# Patient Record
Sex: Male | Born: 1955 | Race: White | Hispanic: No | Smoking: Never smoker
Health system: Southern US, Community
[De-identification: ages and names within clinical notes are randomized; demographics above are authoritative.]

## PROBLEM LIST (undated history)

## (undated) DIAGNOSIS — I10 Essential (primary) hypertension: Secondary | ICD-10-CM

## (undated) DIAGNOSIS — E785 Hyperlipidemia, unspecified: Secondary | ICD-10-CM

## (undated) DIAGNOSIS — Z9289 Personal history of other medical treatment: Secondary | ICD-10-CM

## (undated) DIAGNOSIS — M791 Myalgia, unspecified site: Secondary | ICD-10-CM

## (undated) HISTORY — DX: Myalgia, unspecified site: M79.10

## (undated) HISTORY — DX: Hyperlipidemia, unspecified: E78.5

## (undated) HISTORY — DX: Personal history of other medical treatment: Z92.89

## (undated) HISTORY — DX: Essential (primary) hypertension: I10

---

## 2011-03-12 DIAGNOSIS — Z9289 Personal history of other medical treatment: Secondary | ICD-10-CM

## 2011-03-12 HISTORY — DX: Personal history of other medical treatment: Z92.89

## 2012-10-08 ENCOUNTER — Encounter: Payer: Self-pay | Admitting: *Deleted

## 2012-10-08 ENCOUNTER — Other Ambulatory Visit: Payer: Self-pay | Admitting: *Deleted

## 2012-10-08 MED ORDER — VALSARTAN 160 MG PO TABS: 160.0000 mg | ORAL_TABLET | Freq: Every day | ORAL | Status: DC

## 2012-10-16 ENCOUNTER — Encounter: Payer: Self-pay | Admitting: Internal Medicine

## 2012-11-17 ENCOUNTER — Telehealth: Payer: Self-pay | Admitting: Hematology & Oncology

## 2012-11-17 NOTE — Telephone Encounter (Signed)
Pt called wanting to see Dr. Myna Hidalgo for possible prostate CA. Per Dr. Myna Hidalgo pt is aware he needs to get biopsy first and call us when the pathology results come back

## 2013-03-25 ENCOUNTER — Other Ambulatory Visit: Payer: Self-pay | Admitting: Internal Medicine

## 2013-03-28 NOTE — Telephone Encounter (Signed)
Rx was sent to pharmacy electronically. 

## 2013-03-30 ENCOUNTER — Other Ambulatory Visit: Payer: Self-pay

## 2013-03-30 MED ORDER — ATENOLOL 25 MG PO TABS: 25.0000 mg | ORAL_TABLET | Freq: Every day | ORAL | Status: DC

## 2013-03-30 NOTE — Telephone Encounter (Signed)
Rx was sent to pharmacy electronically. 

## 2013-03-31 ENCOUNTER — Other Ambulatory Visit: Payer: Self-pay | Admitting: Internal Medicine

## 2013-04-01 NOTE — Telephone Encounter (Signed)
Rx was sent to pharmacy electronically. 

## 2013-04-26 ENCOUNTER — Other Ambulatory Visit: Payer: Self-pay | Admitting: Internal Medicine

## 2013-04-27 NOTE — Telephone Encounter (Signed)
Rx was sent to pharmacy electronically. 

## 2013-07-27 ENCOUNTER — Telehealth: Payer: Self-pay | Admitting: *Deleted

## 2013-07-27 ENCOUNTER — Other Ambulatory Visit: Payer: Self-pay | Admitting: Internal Medicine

## 2013-07-27 NOTE — Telephone Encounter (Signed)
Gabriel MuirJamie from [facility] called and wanted to get Mr. Gabriel Woodward's medicine approved.  CH

## 2013-07-27 NOTE — Telephone Encounter (Signed)
Rx was sent to pharmacy electronically. Patient will need an appointment for any future refills.

## 2013-07-29 ENCOUNTER — Other Ambulatory Visit: Payer: Self-pay | Admitting: Internal Medicine

## 2013-07-29 NOTE — Telephone Encounter (Signed)
Rx was sent to pharmacy electronically. 

## 2013-08-11 ENCOUNTER — Other Ambulatory Visit: Payer: Self-pay | Admitting: Internal Medicine

## 2013-08-11 NOTE — Telephone Encounter (Signed)
Rx was sent to pharmacy electronically. 

## 2013-08-19 ENCOUNTER — Other Ambulatory Visit: Payer: Self-pay | Admitting: Internal Medicine

## 2013-08-22 NOTE — Telephone Encounter (Signed)
Rx was sent to pharmacy electronically. 

## 2013-08-23 ENCOUNTER — Telehealth: Payer: Self-pay | Admitting: Internal Medicine

## 2013-08-23 NOTE — Telephone Encounter (Signed)
Message forwarded to Jenna, RN.  

## 2013-08-23 NOTE — Telephone Encounter (Signed)
Would you fax his lab order down to [facility] today. He wants to have lab work before his appt.on Friday.

## 2013-08-24 ENCOUNTER — Other Ambulatory Visit: Payer: Self-pay | Admitting: *Deleted

## 2013-08-24 ENCOUNTER — Telehealth: Payer: Self-pay | Admitting: Internal Medicine

## 2013-08-24 DIAGNOSIS — I10 Essential (primary) hypertension: Secondary | ICD-10-CM

## 2013-08-24 DIAGNOSIS — E782 Mixed hyperlipidemia: Secondary | ICD-10-CM

## 2013-08-24 LAB — CBC
HCT: 40.2 % (ref 39.0–52.0)
Hemoglobin: 13.7 g/dL (ref 13.0–17.0)
MCH: 28.8 pg (ref 26.0–34.0)
MCHC: 34.1 g/dL (ref 30.0–36.0)
MCV: 84.6 fL (ref 78.0–100.0)
Platelets: 235 10*3/uL (ref 150–400)
RBC: 4.75 MIL/uL (ref 4.22–5.81)
RDW: 13.8 % (ref 11.5–15.5)
WBC: 5.1 10*3/uL (ref 4.0–10.5)

## 2013-08-24 LAB — COMPREHENSIVE METABOLIC PANEL
ALK PHOS: 52 U/L (ref 39–117)
ALT: 15 U/L (ref 0–53)
AST: 18 U/L (ref 0–37)
Albumin: 3.9 g/dL (ref 3.5–5.2)
BILIRUBIN TOTAL: 0.6 mg/dL (ref 0.2–1.2)
BUN: 16 mg/dL (ref 6–23)
CO2: 27 mEq/L (ref 19–32)
Calcium: 8.8 mg/dL (ref 8.4–10.5)
Chloride: 107 mEq/L (ref 96–112)
Creat: 1 mg/dL (ref 0.50–1.35)
Glucose, Bld: 95 mg/dL (ref 70–99)
Potassium: 4.6 mEq/L (ref 3.5–5.3)
SODIUM: 140 meq/L (ref 135–145)
TOTAL PROTEIN: 5.8 g/dL — AB (ref 6.0–8.3)

## 2013-08-24 LAB — LIPID PANEL
CHOL/HDL RATIO: 3.7 ratio
CHOLESTEROL: 172 mg/dL (ref 0–200)
HDL: 46 mg/dL (ref >39)
LDL Cholesterol: 103 mg/dL — ABNORMAL HIGH (ref 0–99)
Triglycerides: 115 mg/dL (ref <150)
VLDL: 23 mg/dL (ref 0–40)

## 2013-08-24 NOTE — Telephone Encounter (Signed)
Lab orders placed by Gabriel Woodward - [facility] in [facility] now

## 2013-08-24 NOTE — Patient Instructions (Signed)
Patient requested orders to be sent to [facility] so that he can have blood drawn prior to his appointment. Order for C-MET, CBC, LIPID ordered. 

## 2013-08-24 NOTE — Telephone Encounter (Signed)
Lab order placed by Henrene PastorW. Waddell - [facility]

## 2013-08-26 ENCOUNTER — Encounter: Payer: Self-pay | Admitting: Internal Medicine

## 2013-08-26 ENCOUNTER — Encounter: Payer: Self-pay | Admitting: *Deleted

## 2013-08-26 ENCOUNTER — Ambulatory Visit (INDEPENDENT_AMBULATORY_CARE_PROVIDER_SITE_OTHER): Payer: BC Managed Care – PPO | Admitting: Internal Medicine

## 2013-08-26 VITALS — BP 144/94 | HR 57 | Ht 72.0 in | Wt 188.8 lb

## 2013-08-26 DIAGNOSIS — I10 Essential (primary) hypertension: Secondary | ICD-10-CM

## 2013-08-26 DIAGNOSIS — R079 Chest pain, unspecified: Secondary | ICD-10-CM

## 2013-08-26 DIAGNOSIS — E785 Hyperlipidemia, unspecified: Secondary | ICD-10-CM

## 2013-08-26 MED ORDER — VALSARTAN 160 MG PO TABS: ORAL_TABLET | ORAL | Status: DC

## 2013-08-26 MED ORDER — ATENOLOL 25 MG PO TABS: ORAL_TABLET | ORAL | Status: DC

## 2013-08-26 MED ORDER — LIVALO 2 MG PO TABS: ORAL_TABLET | ORAL | Status: DC

## 2013-08-26 MED ORDER — HYDROCHLOROTHIAZIDE 12.5 MG PO CAPS: ORAL_CAPSULE | ORAL | Status: DC

## 2013-08-26 NOTE — Patient Instructions (Signed)
Your physician has requested that you have an exercise tolerance test. For further information please visit [email or web address]. Please also follow instruction sheet, as given.  Your physician wants you to follow-up in: 1 year. You will receive a reminder letter in the mail two months in advance. If you don't receive a letter, please call our office to schedule the follow-up appointment.  

## 2013-08-26 NOTE — Progress Notes (Signed)
OFFICE NOTE  Chief Complaint:  Annual follow-up, chest pain  Primary Care Physician: No PCP Per Patient  HPI:  Gabriel Woodward is a 58 year old gentleman previously followed by Dr. Clarene DukeLittle who followed his father who had a strong family history of heart disease. The patient has now coronary disease, however, is certainly concerned given his family history. He does have a history of hypertension and dyslipidemia which has been well controlled. He also has a history of myalgias on Crestor and recently was switched to Pravachol. This also seemed to cause him symptoms. He is quite weary of taking any further statin medications. He recently had a lipid profile performed on June 14, 2012 which showed a total cholesterol of 231, triglycerides 216, HDL 42, LDL of 146. TSH was within normal limits as well as a CBC and CMP. I reviewed the results with the patient today, indicating that he still continues to have elevated cholesterol which would put him at risk, especially given his family history.  I started him on Livalo 2 mg daily at his last office visit. He's taken the medication without any side effects.  He recently had repeat blood work which shows normal renal function, liver function and his cholesterol profile was markedly improved. Total cholesterol was down to 172 and LDL cholesterol was 103.  Overall he seems to be doing well except he reported a couple times when he felt like he was walking up until he was a little short of breath had some discomfort in the top of his chest and felt that his heart was racing. He does not exercise regularly enough to recreate his symptoms. He did have a treadmill exercise stress test last in 2012.  PMHx:  Past Medical History  Diagnosis Date  . Hypertension   . Hyperlipemia   . Myalgia   . H/O exercise stress test 03/12/2011    Treadmill was negative for ischemic graded, patient demontrated excellent excersie capacity    History reviewed. No  pertinent past surgical history.  FAMHx:  Family History  Problem Relation Age of Onset  . CAD Father     byass surgery early 6260's first MI late 50'sor 6760's  . Hypertension Mother     SOCHx:   does not have a smoking history on file. He has never used smokeless tobacco. He reports that he drinks about 1.5 - 2 ounces of alcohol per week. He reports that he does not use illicit drugs.  ALLERGIES:  No Known Allergies  ROS: A comprehensive review of systems was negative except for: Cardiovascular: positive for chest pain and palpitations  HOME MEDS: Current Outpatient Prescriptions  Medication Sig Dispense Refill  . aspirin 81 MG tablet Take 81 mg by mouth daily.      Marland Kitchen. atenolol (TENORMIN) 25 MG tablet take 1 tablet by mouth once daily  90 tablet  3  . hydrochlorothiazide (MICROZIDE) 12.5 MG capsule take 1 capsule by mouth once daily  90 capsule  3  . LIVALO 2 MG TABS take 1 tablet by mouth at bedtime  90 tablet  3  . valsartan (DIOVAN) 160 MG tablet take 1 tablet by mouth once daily  90 tablet  3   No current facility-administered medications for this visit.    LABS/IMAGING: No results found for this or any previous visit (from the past 48 hour(s)). No results found.  VITALS: BP 144/94  Pulse 57  Ht 6' (1.829 m)  Wt 188 lb 12.8 oz (85.639 kg)  BMI  25.60 kg/m2  EXAM: General appearance: alert and no distress Neck: no carotid bruit and no JVD Lungs: clear to auscultation bilaterally Heart: regular rate and rhythm, S1, S2 normal, no murmur, click, rub or gallop Abdomen: soft, non-tender; bowel sounds normal; no masses,  no organomegaly Extremities: extremities normal, atraumatic, no cyanosis or edema Pulses: 2+ and symmetric Skin: Skin color, texture, turgor normal. No rashes or lesions Neurologic: Grossly normal Psych: Mood, affect normal  EKG: Sinus bradycardia 57  ASSESSMENT: 1. Chest pain with exertion 2. Hypertension 3. Dyslipidemia-improved  PLAN: 1.    Mr. [name] has a much better lipid profile now that he is on a statin. He did describe a couple episodes of chest tightness and heart racing with exercise. This does not sound cardiac however could be. He does have a compelling family history of heart disease. I would recommend a Bruce treadmill stress test to compare to his study in 2012. I'll contact him with those results. Otherwise plan to see him back annually.  Chrystie NoseKenneth C. Jencarlo Bonadonna, MD, [facility] Attending Cardiologist [facility]  Chrystie NoseKenneth C. Feleica Fulmore 08/26/2013, 12:34 PM

## 2013-08-29 NOTE — Telephone Encounter (Signed)
Encounter Closed---08/29/13 TP 

## 2013-09-27 ENCOUNTER — Encounter (HOSPITAL_COMMUNITY): Payer: BC Managed Care – PPO

## 2013-10-05 ENCOUNTER — Telehealth (HOSPITAL_COMMUNITY): Payer: Self-pay

## 2013-10-07 ENCOUNTER — Ambulatory Visit (HOSPITAL_COMMUNITY)
Admission: RE | Admit: 2013-10-07 | Discharge: 2013-10-07 | Disposition: A | Discharge status: Home / Self Care | Payer: BC Managed Care – PPO | Source: Ambulatory Visit | Attending: Cardiology | Admitting: Cardiology

## 2013-10-07 DIAGNOSIS — R079 Chest pain, unspecified: Secondary | ICD-10-CM

## 2013-10-17 ENCOUNTER — Telehealth: Payer: Self-pay | Admitting: *Deleted

## 2013-10-17 NOTE — Telephone Encounter (Signed)
Patient notified that stress test was normal.

## 2013-11-03 NOTE — Telephone Encounter (Signed)
Encounter complete. 

## 2014-08-25 ENCOUNTER — Other Ambulatory Visit: Payer: Self-pay | Admitting: Internal Medicine

## 2014-08-25 NOTE — Telephone Encounter (Signed)
Rx(s) sent to pharmacy electronically. OV 09/28/14

## 2014-09-27 ENCOUNTER — Telehealth: Payer: Self-pay | Admitting: Internal Medicine

## 2014-09-27 DIAGNOSIS — E785 Hyperlipidemia, unspecified: Secondary | ICD-10-CM

## 2014-09-27 LAB — LIPID PANEL
Cholesterol: 194 mg/dL (ref 0–200)
HDL: 49 mg/dL (ref >=40)
LDL Cholesterol: 119 mg/dL — ABNORMAL HIGH (ref 0–99)
TRIGLYCERIDES: 131 mg/dL (ref <150)
Total CHOL/HDL Ratio: 4 Ratio
VLDL: 26 mg/dL (ref 0–40)

## 2014-09-27 LAB — CBC WITH DIFFERENTIAL/PLATELET
Basophils Absolute: 0.1 10*3/uL (ref 0.0–0.1)
Basophils Relative: 1 % (ref 0–1)
EOS PCT: 4 % (ref 0–5)
Eosinophils Absolute: 0.2 10*3/uL (ref 0.0–0.7)
HEMATOCRIT: 45.4 % (ref 39.0–52.0)
HEMOGLOBIN: 15.2 g/dL (ref 13.0–17.0)
LYMPHS ABS: 1.1 10*3/uL (ref 0.7–4.0)
Lymphocytes Relative: 20 % (ref 12–46)
MCH: 28.8 pg (ref 26.0–34.0)
MCHC: 33.5 g/dL (ref 30.0–36.0)
MCV: 86 fL (ref 78.0–100.0)
MONOS PCT: 10 % (ref 3–12)
MPV: 9.7 fL (ref 8.6–12.4)
Monocytes Absolute: 0.5 10*3/uL (ref 0.1–1.0)
NEUTROS ABS: 3.4 10*3/uL (ref 1.7–7.7)
Neutrophils Relative %: 65 % (ref 43–77)
Platelets: 285 10*3/uL (ref 150–400)
RBC: 5.28 MIL/uL (ref 4.22–5.81)
RDW: 13.5 % (ref 11.5–15.5)
WBC: 5.3 10*3/uL (ref 4.0–10.5)

## 2014-09-27 LAB — COMPREHENSIVE METABOLIC PANEL
ALBUMIN: 4.4 g/dL (ref 3.5–5.2)
ALK PHOS: 51 U/L (ref 39–117)
ALT: 16 U/L (ref 0–53)
AST: 19 U/L (ref 0–37)
BILIRUBIN TOTAL: 0.7 mg/dL (ref 0.2–1.2)
BUN: 14 mg/dL (ref 6–23)
CHLORIDE: 105 meq/L (ref 96–112)
CO2: 28 meq/L (ref 19–32)
CREATININE: 0.98 mg/dL (ref 0.50–1.35)
Calcium: 9.3 mg/dL (ref 8.4–10.5)
Glucose, Bld: 92 mg/dL (ref 70–99)
POTASSIUM: 5 meq/L (ref 3.5–5.3)
SODIUM: 142 meq/L (ref 135–145)
Total Protein: 6.3 g/dL (ref 6.0–8.3)

## 2014-09-27 NOTE — Telephone Encounter (Signed)
Pt has an appt tomorrow with Dr Rennis GoldenHilty. He usually gets his lab work before his appt.[facility] He is coming in a little while to get it.done.

## 2014-09-27 NOTE — Telephone Encounter (Signed)
Lab orders entered,cmet,lipid panel,cbc.

## 2014-09-28 ENCOUNTER — Ambulatory Visit (INDEPENDENT_AMBULATORY_CARE_PROVIDER_SITE_OTHER): Payer: BLUE CROSS/BLUE SHIELD | Admitting: Internal Medicine

## 2014-09-28 ENCOUNTER — Encounter: Payer: Self-pay | Admitting: Internal Medicine

## 2014-09-28 VITALS — BP 148/86 | HR 61 | Ht 72.0 in | Wt 186.5 lb

## 2014-09-28 DIAGNOSIS — E785 Hyperlipidemia, unspecified: Secondary | ICD-10-CM | POA: Diagnosis not present

## 2014-09-28 DIAGNOSIS — Z87898 Personal history of other specified conditions: Secondary | ICD-10-CM

## 2014-09-28 DIAGNOSIS — Z8679 Personal history of other diseases of the circulatory system: Secondary | ICD-10-CM

## 2014-09-28 DIAGNOSIS — I10 Essential (primary) hypertension: Secondary | ICD-10-CM

## 2014-09-28 DIAGNOSIS — R002 Palpitations: Secondary | ICD-10-CM | POA: Diagnosis not present

## 2014-09-28 NOTE — Patient Instructions (Signed)
Your physician recommends that you return for lab work in 1 year - FASTING.  Dr Rennis GoldenHilty recommends that you schedule a follow-up appointment in 1 year. You will receive a reminder letter in the mail two months in advance. If you don't receive a letter, please call our office to schedule the follow-up appointment.

## 2014-09-29 ENCOUNTER — Encounter: Payer: Self-pay | Admitting: Internal Medicine

## 2014-09-29 DIAGNOSIS — Z87898 Personal history of other specified conditions: Secondary | ICD-10-CM | POA: Insufficient documentation

## 2014-09-29 NOTE — Progress Notes (Signed)
OFFICE NOTE  Chief Complaint:  Annual follow-up, no complaints  Primary Care Physician: No PCP Per Patient  HPI:  Gabriel Woodward is a 59 year old gentleman previously followed by Dr. Clarene DukeLittle who followed his father who had a strong family history of heart disease. The patient has now coronary disease, however, is certainly concerned given his family history. He does have a history of hypertension and dyslipidemia which has been well controlled. He also has a history of myalgias on Crestor and recently was switched to Pravachol. This also seemed to cause him symptoms. He is quite weary of taking any further statin medications. He recently had a lipid profile performed on June 14, 2012 which showed a total cholesterol of 231, triglycerides 216, HDL 42, LDL of 146. TSH was within normal limits as well as a CBC and CMP. I reviewed the results with the patient today, indicating that he still continues to have elevated cholesterol which would put him at risk, especially given his family history.  I started him on Livalo 2 mg daily at his last office visit. He's taken the medication without any side effects.  He recently had repeat blood work which shows normal renal function, liver function and his cholesterol profile was markedly improved. Total cholesterol was down to 172 and LDL cholesterol was 103.  Overall he seems to be doing well except he reported a couple times when he felt like he was walking up until he was a little short of breath had some discomfort in the top of his chest and felt that his heart was racing. He does not exercise regularly enough to recreate his symptoms. He did have a treadmill exercise stress test last in 2012.  Gabriel Woodward returns today for follow-up. Overall he is doing well and has no complaints. Denies chest pain or shortness of breath. He had recent laboratory work done yesterday and the results are as follows: Total cholesterol 194, triglycerides 131, HDL 49 and  LDL 119. This is mildly increased compared to his cluster profile the past however may be accounted for recent dietary indiscretion. I do not see a clear indication to change his medicines. Blood pressure is fairly well-controlled today. He says it's been lower at home.  PMHx:  Past Medical History  Diagnosis Date  . Hypertension   . Hyperlipemia   . Myalgia   . H/O exercise stress test 03/12/2011    Treadmill was negative for ischemic graded, patient demontrated excellent excersie capacity    No past surgical history on file.  FAMHx:  Family History  Problem Relation Age of Onset  . CAD Father     byass surgery early 7460's first MI late 50'sor 7260's  . Hypertension Mother     SOCHx:   reports that he has never smoked. He has never used smokeless tobacco. He reports that he drinks about 1.8 - 2.4 oz of alcohol per week. He reports that he does not use illicit drugs.  ALLERGIES:  No Known Allergies  ROS: A comprehensive review of systems was negative.  HOME MEDS: Current Outpatient Prescriptions  Medication Sig Dispense Refill  . aspirin 81 MG tablet Take 81 mg by mouth daily.    Marland Kitchen. atenolol (TENORMIN) 25 MG tablet take 1 tablet by mouth once daily 90 tablet 0  . hydrochlorothiazide (MICROZIDE) 12.5 MG capsule take 1 capsule by mouth once daily 90 capsule 0  . LIVALO 2 MG TABS take 1 tablet by mouth at bedtime 90 tablet 3  .  valsartan (DIOVAN) 160 MG tablet take 1 tablet by mouth once daily 90 tablet 0   No current facility-administered medications for this visit.    LABS/IMAGING: No results found for this or any previous visit (from the past 48 hour(s)). No results found.  VITALS: BP 148/86 mmHg  Pulse 61  Ht 6' (1.829 m)  Wt 186 lb 8 oz (84.596 kg)  BMI 25.29 kg/m2  EXAM: General appearance: alert and no distress Neck: no carotid bruit and no JVD Lungs: clear to auscultation bilaterally Heart: regular rate and rhythm, S1, S2 normal, no murmur, click, rub or  gallop Abdomen: soft, non-tender; bowel sounds normal; no masses,  no organomegaly Extremities: extremities normal, atraumatic, no cyanosis or edema Pulses: 2+ and symmetric Skin: Skin color, texture, turgor normal. No rashes or lesions Neurologic: Grossly normal Psych: Mood, affect normal  EKG: Normal sinus rhythm at 61  ASSESSMENT: 1. Hypertension 2. Dyslipidemia 3. History of palpitations  PLAN: 1.   Gabriel Woodward has good control of his cholesterol. I recommend he stay on his current dose of Livalo. Blood pressure is also well controlled. He denies any chest pain or shortness of breath. He denies any recurrent palpitations. Overall doing well and plan to see him back annually or sooner as necessary.  Chrystie Nose, MD, [facility] Attending Cardiologist [facility]  Chrystie Nose 09/29/2014, 2:17 PM

## 2014-11-24 ENCOUNTER — Other Ambulatory Visit: Payer: Self-pay | Admitting: Internal Medicine

## 2014-11-24 NOTE — Telephone Encounter (Signed)
Rx(s) sent to pharmacy electronically.  

## 2014-11-30 ENCOUNTER — Other Ambulatory Visit: Payer: Self-pay | Admitting: Internal Medicine

## 2014-11-30 NOTE — Telephone Encounter (Signed)
REFILL 

## 2015-05-26 ENCOUNTER — Other Ambulatory Visit: Payer: Self-pay | Admitting: Internal Medicine

## 2015-05-28 NOTE — Telephone Encounter (Signed)
Rx request sent to pharmacy.  

## 2015-06-01 ENCOUNTER — Telehealth: Payer: Self-pay

## 2015-06-01 NOTE — Telephone Encounter (Signed)
Prior authorization for Livalo 2 mg sent to patient's insurance company via covermymeds. Awaiting approval. 

## 2015-06-04 NOTE — Telephone Encounter (Signed)
Prior authorization for Livalo 2 mg has been denied.

## 2015-06-15 NOTE — Telephone Encounter (Signed)
PA for livalo approved from 06/01/2015 - 04/27/2038

## 2015-06-15 NOTE — Telephone Encounter (Signed)
Resent prior authorization due to being filled out incorrectly.

## 2015-08-30 ENCOUNTER — Other Ambulatory Visit: Payer: Self-pay | Admitting: *Deleted

## 2015-08-30 DIAGNOSIS — E785 Hyperlipidemia, unspecified: Secondary | ICD-10-CM

## 2015-11-27 ENCOUNTER — Other Ambulatory Visit: Payer: Self-pay | Admitting: Internal Medicine

## 2015-12-02 ENCOUNTER — Other Ambulatory Visit: Payer: Self-pay | Admitting: Internal Medicine

## 2015-12-03 NOTE — Telephone Encounter (Signed)
Rx(s) sent to pharmacy electronically.  

## 2016-04-10 ENCOUNTER — Other Ambulatory Visit: Payer: Self-pay | Admitting: Internal Medicine

## 2016-05-16 ENCOUNTER — Telehealth: Payer: Self-pay | Admitting: Internal Medicine

## 2016-05-16 DIAGNOSIS — Z79899 Other long term (current) drug therapy: Secondary | ICD-10-CM

## 2016-05-16 DIAGNOSIS — E785 Hyperlipidemia, unspecified: Secondary | ICD-10-CM

## 2016-05-16 NOTE — Telephone Encounter (Signed)
CMET, CBC, Lipid ordered.   Patient aware.

## 2016-05-16 NOTE — Telephone Encounter (Signed)
New Message      Can you put in the order for his lab work and call him , he has appt with Dr Rennis GoldenHilty on 06/03/16

## 2016-05-21 ENCOUNTER — Other Ambulatory Visit: Payer: Self-pay | Admitting: Internal Medicine

## 2016-05-21 MED ORDER — LIVALO 2 MG PO TABS: 1.0000 | ORAL_TABLET | Freq: Every day | ORAL | 0 refills | Status: DC

## 2016-05-21 NOTE — Telephone Encounter (Signed)
°*  STAT* If patient is at the pharmacy, call can be transferred to refill team.   1. Which medications need to be refilled? (please list name of each medication and dose if known) Livalo 2mg   2. Which pharmacy/location (including street and city if local pharmacy) is medication to be sent to? [facility] store [phone]#[id number] [address] [place], [place] [phone]  3. Do they need a 30 day or 90 day supply?90

## 2016-05-21 NOTE — Telephone Encounter (Signed)
Rx(s) sent to pharmacy electronically.  

## 2016-05-30 LAB — LIPID PANEL
CHOL/HDL RATIO: 5.1 ratio — AB (ref <5.0)
Cholesterol: 220 mg/dL — ABNORMAL HIGH (ref <200)
HDL: 43 mg/dL (ref >40)
LDL Cholesterol: 144 mg/dL — ABNORMAL HIGH (ref <100)
Triglycerides: 164 mg/dL — ABNORMAL HIGH (ref <150)
VLDL: 33 mg/dL — ABNORMAL HIGH (ref <30)

## 2016-06-03 ENCOUNTER — Encounter: Payer: Self-pay | Admitting: Internal Medicine

## 2016-06-03 ENCOUNTER — Ambulatory Visit (INDEPENDENT_AMBULATORY_CARE_PROVIDER_SITE_OTHER): Payer: BLUE CROSS/BLUE SHIELD | Admitting: Internal Medicine

## 2016-06-03 VITALS — BP 120/74 | HR 64 | Ht 72.0 in | Wt 190.2 lb

## 2016-06-03 DIAGNOSIS — I10 Essential (primary) hypertension: Secondary | ICD-10-CM | POA: Diagnosis not present

## 2016-06-03 DIAGNOSIS — E785 Hyperlipidemia, unspecified: Secondary | ICD-10-CM | POA: Diagnosis not present

## 2016-06-03 DIAGNOSIS — R002 Palpitations: Secondary | ICD-10-CM | POA: Diagnosis not present

## 2016-06-03 MED ORDER — PITAVASTATIN CALCIUM 4 MG PO TABS: 4.0000 mg | ORAL_TABLET | Freq: Every day | ORAL | 3 refills | Status: DC

## 2016-06-03 NOTE — Patient Instructions (Addendum)
Your physician has recommended you make the following change in your medication:  -- INCREASE Livalo to 4mg  daily (you can take two of your 2mg  tablets until you run out)  -- 4 boxes of samples & co-pay card provided to patient   Your physician recommends that you return for lab work in: THREE MONTHS (fasting - to check cholesterol)  Your physician wants you to follow-up in: ONE YEAR with Dr. Rennis GoldenHilty. You will receive a reminder letter in the mail two months in advance. If you don't receive a letter, please call our office to schedule the follow-up appointment.

## 2016-06-03 NOTE — Progress Notes (Signed)
OFFICE NOTE  Chief Complaint:  Annual follow-up, no complaints  Primary Care Physician: No PCP Per Patient  HPI:  Gabriel Woodward is a 61 year old gentleman previously followed by Dr. Clarene Duke who followed his father who had a strong family history of heart disease. The patient has now coronary disease, however, is certainly concerned given his family history. He does have a history of hypertension and dyslipidemia which has been well controlled. He also has a history of myalgias on Crestor and recently was switched to Pravachol. This also seemed to cause him symptoms. He is quite weary of taking any further statin medications. He recently had a lipid profile performed on June 14, 2012 which showed a total cholesterol of 231, triglycerides 216, HDL 42, LDL of 146. TSH was within normal limits as well as a CBC and CMP. I reviewed the results with the patient today, indicating that he still continues to have elevated cholesterol which would put him at risk, especially given his family history.  I started him on Livalo 2 mg daily at his last office visit. He's taken the medication without any side effects.  He recently had repeat blood work which shows normal renal function, liver function and his cholesterol profile was markedly improved. Total cholesterol was down to 172 and LDL cholesterol was 103.  Overall he seems to be doing well except he reported a couple times when he felt like he was walking up until he was a little short of breath had some discomfort in the top of his chest and felt that his heart was racing. He does not exercise regularly enough to recreate his symptoms. He did have a treadmill exercise stress test last in 2012.  Gabriel Woodward returns today for follow-up. Overall he is doing well and has no complaints. Denies chest pain or shortness of breath. He had recent laboratory work done yesterday and the results are as follows: Total cholesterol 194, triglycerides 131, HDL 49 and  LDL 119. This is mildly increased compared to his cluster profile the past however may be accounted for recent dietary indiscretion. I do not see a clear indication to change his medicines. Blood pressure is fairly well-controlled today. He says it's been lower at home.  06/03/2016  Gabriel Woodward returns today for follow-up. Her last series done very well. Blood pressures are well-controlled today 120/74. EKG shows sinus rhythm at 64. He underwent recent cholesterol testing. He does have a history of statin intolerance including atorvastatin and simvastatin. His most recent cholesterol was total cholesterol of 220, HDL-C 43, LDL-C1 44, triglyceride 164. This is increased with LDL of 119 one year ago and 1031 year prior to that. He continues to take Livalo 2 mg daily which she seems to tolerate. The only issue he has is cost of the medication which recently was $500 for 90 day supply.  PMHx:  Past Medical History:  Diagnosis Date  . H/O exercise stress test 03/12/2011   Treadmill was negative for ischemic graded, patient demontrated excellent excersie capacity  . Hyperlipemia   . Hypertension   . Myalgia     No past surgical history on file.  FAMHx:  Family History  Problem Relation Age of Onset  . CAD Father     byass surgery early 45's first MI late 50'sor 32's  . Hypertension Mother     SOCHx:   reports that he has never smoked. He has never used smokeless tobacco. He reports that he drinks about 1.8 - 2.4 oz  of alcohol per week . He reports that he does not use drugs.  ALLERGIES:  No Known Allergies  ROS: Pertinent items noted in HPI and remainder of comprehensive ROS otherwise negative.  HOME MEDS: Current Outpatient Prescriptions  Medication Sig Dispense Refill  . aspirin 81 MG tablet Take 81 mg by mouth daily.    Marland Kitchen. atenolol (TENORMIN) 25 MG tablet take 1 tablet by mouth once daily 90 tablet 1  . hydrochlorothiazide (MICROZIDE) 12.5 MG capsule take 1 capsule by mouth once  daily 90 capsule 1  . valsartan (DIOVAN) 160 MG tablet take 1 tablet by mouth once daily 90 tablet 1  . Pitavastatin Calcium 4 MG TABS Take 1 tablet (4 mg total) by mouth daily. 90 tablet 3   No current facility-administered medications for this visit.     LABS/IMAGING: No results found for this or any previous visit (from the past 48 hour(s)). No results found.  VITALS: BP 120/74   Pulse 64   Ht 6' (1.829 m)   Wt 190 lb 3.2 oz (86.3 kg)   BMI 25.80 kg/m   EXAM: General appearance: alert and no distress Neck: no carotid bruit and no JVD Lungs: clear to auscultation bilaterally Heart: regular rate and rhythm, S1, S2 normal, no murmur, click, rub or gallop Abdomen: soft, non-tender; bowel sounds normal; no masses,  no organomegaly Extremities: extremities normal, atraumatic, no cyanosis or edema Pulses: 2+ and symmetric Skin: Skin color, texture, turgor normal. No rashes or lesions Neurologic: Grossly normal Psych: Mood, affect normal  EKG: Normal sinus rhythm at 64  ASSESSMENT: 1. Hypertension 2. Dyslipidemia 3. History of palpitations  PLAN: 1.   Gabriel Woodward has excellent blood pressure control and no significant palpitations. Cholesterol still not at goal despite being on a low-dose of moderate intensity statin. He's concerned about the cost however given his partial insurance he should be able to get the medication with only $20 co-pay. We provided a co-pay assistance card today and I recommended increasing his Livalo to 4 mg daily. Plan repeat lipid profile in 3 months. If he still not at goal LDL we may consider adding ezetimibe 10 mg daily.  Follow-up annually or sooner as necessary.  Chrystie NoseKenneth C. Marsheila Alejo, MD, [facility] Attending Cardiologist [facility]  Chrystie NoseKenneth C Taraoluwa Thakur 06/03/2016, 5:22 PM

## 2016-06-04 ENCOUNTER — Other Ambulatory Visit: Payer: Self-pay | Admitting: Internal Medicine

## 2016-06-04 NOTE — Telephone Encounter (Signed)
Rx(s) sent to pharmacy electronically.  

## 2016-12-18 ENCOUNTER — Telehealth: Payer: Self-pay | Admitting: *Deleted

## 2016-12-18 MED ORDER — IRBESARTAN 150 MG PO TABS: 150.0000 mg | ORAL_TABLET | Freq: Every day | ORAL | 3 refills | Status: DC

## 2016-12-18 NOTE — Telephone Encounter (Signed)
Patient on valsartan 160 mg.  No indication of HF. Okay to switch to irbesartan 150 mg and check BP at home qod x 2 weeks.

## 2016-12-18 NOTE — Telephone Encounter (Signed)
Patient left a msg on the refill vm in regards to the recall on valsartan. Patient stated that he has a full bottle left . He can be reached at [phone]. Thanks, MI

## 2016-12-18 NOTE — Telephone Encounter (Signed)
Spoke with pt, New script sent to the pharmacy and directions per pharm md discussed.

## 2017-06-11 ENCOUNTER — Other Ambulatory Visit: Payer: Self-pay | Admitting: Internal Medicine

## 2017-06-11 NOTE — Telephone Encounter (Signed)
REFILL 

## 2017-07-19 ENCOUNTER — Other Ambulatory Visit: Payer: Self-pay | Admitting: Internal Medicine

## 2017-07-23 ENCOUNTER — Other Ambulatory Visit: Payer: Self-pay | Admitting: Internal Medicine

## 2017-07-23 DIAGNOSIS — E785 Hyperlipidemia, unspecified: Secondary | ICD-10-CM

## 2017-08-22 ENCOUNTER — Other Ambulatory Visit: Payer: Self-pay | Admitting: Internal Medicine

## 2017-08-22 DIAGNOSIS — E785 Hyperlipidemia, unspecified: Secondary | ICD-10-CM

## 2017-09-09 ENCOUNTER — Other Ambulatory Visit: Payer: Self-pay | Admitting: Internal Medicine

## 2017-09-09 NOTE — Telephone Encounter (Signed)
Rx(s) sent to pharmacy electronically.  

## 2017-09-13 ENCOUNTER — Other Ambulatory Visit: Payer: Self-pay | Admitting: Internal Medicine

## 2017-09-13 DIAGNOSIS — E785 Hyperlipidemia, unspecified: Secondary | ICD-10-CM

## 2017-09-14 NOTE — Telephone Encounter (Signed)
Rx has been sent to the pharmacy electronically. ° °

## 2017-09-21 ENCOUNTER — Other Ambulatory Visit: Payer: Self-pay | Admitting: Internal Medicine

## 2017-09-22 NOTE — Telephone Encounter (Signed)
Rx sent to pharmacy   

## 2017-10-17 ENCOUNTER — Other Ambulatory Visit: Payer: Self-pay | Admitting: Internal Medicine

## 2017-10-19 NOTE — Telephone Encounter (Signed)
Rx request sent to pharmacy.  

## 2017-11-22 ENCOUNTER — Other Ambulatory Visit: Payer: Self-pay | Admitting: Internal Medicine

## 2017-11-23 NOTE — Telephone Encounter (Signed)
Rx(s) sent to pharmacy electronically. MD OV 12/15/17

## 2017-12-08 ENCOUNTER — Other Ambulatory Visit: Payer: Self-pay

## 2017-12-08 MED ORDER — IRBESARTAN 150 MG PO TABS: 150.0000 mg | ORAL_TABLET | Freq: Every day | ORAL | 0 refills | Status: DC

## 2017-12-14 ENCOUNTER — Telehealth: Payer: Self-pay | Admitting: Internal Medicine

## 2017-12-14 DIAGNOSIS — I10 Essential (primary) hypertension: Secondary | ICD-10-CM

## 2017-12-14 DIAGNOSIS — E785 Hyperlipidemia, unspecified: Secondary | ICD-10-CM

## 2017-12-14 NOTE — Telephone Encounter (Signed)
New Message         Patient would like to come in today for labs,  pls advise

## 2017-12-14 NOTE — Telephone Encounter (Signed)
Returned call to patient he stated he wanted to have fasting lab today.Stated he has appointment with Dr.Hilty tomorrow.Advised he can have lab here at office today, lab orders given to lab tech.

## 2017-12-15 ENCOUNTER — Ambulatory Visit (INDEPENDENT_AMBULATORY_CARE_PROVIDER_SITE_OTHER): Payer: BLUE CROSS/BLUE SHIELD | Admitting: Internal Medicine

## 2017-12-15 ENCOUNTER — Other Ambulatory Visit: Payer: Self-pay | Admitting: Internal Medicine

## 2017-12-15 ENCOUNTER — Encounter: Payer: Self-pay | Admitting: Internal Medicine

## 2017-12-15 VITALS — BP 140/98 | HR 60 | Ht 72.0 in | Wt 186.0 lb

## 2017-12-15 DIAGNOSIS — I10 Essential (primary) hypertension: Secondary | ICD-10-CM

## 2017-12-15 DIAGNOSIS — E785 Hyperlipidemia, unspecified: Secondary | ICD-10-CM

## 2017-12-15 LAB — COMPREHENSIVE METABOLIC PANEL
ALT: 16 IU/L (ref 0–44)
AST: 14 IU/L (ref 0–40)
Albumin/Globulin Ratio: 2 (ref 1.2–2.2)
Albumin: 4.5 g/dL (ref 3.6–4.8)
Alkaline Phosphatase: 68 IU/L (ref 39–117)
BUN/Creatinine Ratio: 13 (ref 10–24)
BUN: 14 mg/dL (ref 8–27)
Bilirubin Total: 0.7 mg/dL (ref 0.0–1.2)
CO2: 23 mmol/L (ref 20–29)
CREATININE: 1.1 mg/dL (ref 0.76–1.27)
Calcium: 9.1 mg/dL (ref 8.6–10.2)
Chloride: 104 mmol/L (ref 96–106)
GFR calc non Af Amer: 71 mL/min/{1.73_m2} (ref >59)
GFR, EST AFRICAN AMERICAN: 82 mL/min/{1.73_m2} (ref >59)
GLOBULIN, TOTAL: 2.2 g/dL (ref 1.5–4.5)
Glucose: 95 mg/dL (ref 65–99)
Potassium: 4.6 mmol/L (ref 3.5–5.2)
SODIUM: 142 mmol/L (ref 134–144)
TOTAL PROTEIN: 6.7 g/dL (ref 6.0–8.5)

## 2017-12-15 LAB — CBC WITH DIFFERENTIAL/PLATELET
Basophils Absolute: 0.1 10*3/uL (ref 0.0–0.2)
Basos: 2 %
EOS (ABSOLUTE): 0.2 10*3/uL (ref 0.0–0.4)
Eos: 3 %
HEMOGLOBIN: 15.2 g/dL (ref 13.0–17.7)
Hematocrit: 44.4 % (ref 37.5–51.0)
IMMATURE GRANULOCYTES: 1 %
Immature Grans (Abs): 0 10*3/uL (ref 0.0–0.1)
LYMPHS: 23 %
Lymphocytes Absolute: 1.6 10*3/uL (ref 0.7–3.1)
MCH: 29.3 pg (ref 26.6–33.0)
MCHC: 34.2 g/dL (ref 31.5–35.7)
MCV: 86 fL (ref 79–97)
MONOCYTES: 10 %
Monocytes Absolute: 0.7 10*3/uL (ref 0.1–0.9)
NEUTROS ABS: 4.2 10*3/uL (ref 1.4–7.0)
NEUTROS PCT: 61 %
PLATELETS: 298 10*3/uL (ref 150–450)
RBC: 5.18 x10E6/uL (ref 4.14–5.80)
RDW: 13.2 % (ref 12.3–15.4)
WBC: 6.8 10*3/uL (ref 3.4–10.8)

## 2017-12-15 LAB — LIPID PANEL W/O CHOL/HDL RATIO
CHOLESTEROL TOTAL: 187 mg/dL (ref 100–199)
HDL: 45 mg/dL (ref >39)
LDL Calculated: 120 mg/dL — ABNORMAL HIGH (ref 0–99)
Triglycerides: 111 mg/dL (ref 0–149)
VLDL Cholesterol Cal: 22 mg/dL (ref 5–40)

## 2017-12-15 MED ORDER — EZETIMIBE 10 MG PO TABS: 10.0000 mg | ORAL_TABLET | Freq: Every day | ORAL | 3 refills | Status: DC

## 2017-12-15 NOTE — Progress Notes (Signed)
OFFICE NOTE  Chief Complaint:  No complaints  Primary Care Physician: Patient, No Pcp Per  HPI:  Gabriel Woodward is a 62 year old gentleman previously followed by Dr. Clarene DukeLittle who followed his father who had a strong family history of heart disease. The patient has now coronary disease, however, is certainly concerned given his family history. He does have a history of hypertension and dyslipidemia which has been well controlled. He also has a history of myalgias on Crestor and recently was switched to Pravachol. This also seemed to cause him symptoms. He is quite weary of taking any further statin medications. He recently had a lipid profile performed on June 14, 2012 which showed a total cholesterol of 231, triglycerides 216, HDL 42, LDL of 146. TSH was within normal limits as well as a CBC and CMP. I reviewed the results with the patient today, indicating that he still continues to have elevated cholesterol which would put him at risk, especially given his family history.  I started him on Livalo 2 mg daily at his last office visit. He's taken the medication without any side effects.  He recently had repeat blood work which shows normal renal function, liver function and his cholesterol profile was markedly improved. Total cholesterol was down to 172 and LDL cholesterol was 103.  Overall he seems to be doing well except he reported a couple times when he felt like he was walking up until he was a little short of breath had some discomfort in the top of his chest and felt that his heart was racing. He does not exercise regularly enough to recreate his symptoms. He did have a treadmill exercise stress test last in 2012.  Gabriel Woodward returns today for follow-up. Overall he is doing well and has no complaints. Denies chest pain or shortness of breath. He had recent laboratory work done yesterday and the results are as follows: Total cholesterol 194, triglycerides 131, HDL 49 and LDL 119. This is  mildly increased compared to his cluster profile the past however may be accounted for recent dietary indiscretion. I do not see a clear indication to change his medicines. Blood pressure is fairly well-controlled today. He says it's been lower at home.  06/03/2016  Gabriel Woodward returns today for follow-up. Her last series done very well. Blood pressures are well-controlled today 120/74. EKG shows sinus rhythm at 64. He underwent recent cholesterol testing. He does have a history of statin intolerance including atorvastatin and simvastatin. His most recent cholesterol was total cholesterol of 220, HDL-C 43, LDL-C1 44, triglyceride 164. This is increased with LDL of 119 one year ago and 1031 year prior to that. He continues to take Livalo 2 mg daily which she seems to tolerate. The only issue he has is cost of the medication which recently was $500 for 90 day supply.  12/15/2017  Gabriel Woodward was seen today in follow-up.  Blood pressure was slightly elevated at 140/98.  He says he does not follow it regularly at home but concerns me that his blood pressure might not be well controlled.  In addition he had increased his Livalo to 4 mg daily.  Repeat lipid profile shows mild improvement in his lipid profile including a decrease in triglycerides from 164-111 and calculated LDL from 144-120.  Despite this, he is not a goal LDL less than 100.  PMHx:  Past Medical History:  Diagnosis Date  . H/O exercise stress test 03/12/2011   Treadmill was negative for ischemic graded, patient  demontrated excellent excersie capacity  . Hyperlipemia   . Hypertension   . Myalgia     No past surgical history on file.  FAMHx:  Family History  Problem Relation Age of Onset  . CAD Father        byass surgery early 2660's first MI late 50'sor 5160's  . Hypertension Mother     SOCHx:   reports that he has never smoked. He has never used smokeless tobacco. He reports that he drinks about 3.0 - 4.0 standard drinks of alcohol  per week. He reports that he does not use drugs.  ALLERGIES:  No Known Allergies  ROS: Pertinent items noted in HPI and remainder of comprehensive ROS otherwise negative.  HOME MEDS: Current Outpatient Medications  Medication Sig Dispense Refill  . aspirin 81 MG tablet Take 81 mg by mouth daily.    Marland Kitchen. atenolol (TENORMIN) 25 MG tablet Take 1 tablet (25 mg total) by mouth daily. 30 tablet 0  . hydrochlorothiazide (MICROZIDE) 12.5 MG capsule TAKE ONE CAPSULE BY MOUTH DAILY 90 capsule 0  . irbesartan (AVAPRO) 150 MG tablet Take 1 tablet (150 mg total) by mouth daily. 90 tablet 0  . LIVALO 4 MG TABS TAKE 1 TABLET(4 MG) BY MOUTH DAILY 90 tablet 0   No current facility-administered medications for this visit.     LABS/IMAGING: Results for orders placed or performed in visit on 12/14/17 (from the past 48 hour(s))  Comprehensive Metabolic Panel (CMET)     Status: None   Collection Time: 12/14/17 12:17 PM  Result Value Ref Range   Glucose 95 65 - 99 mg/dL   BUN 14 8 - 27 mg/dL   Creatinine, Ser 4.781.10 0.76 - 1.27 mg/dL   GFR calc non Af Amer 71 >59 mL/min/1.73   GFR calc Af Amer 82 >59 mL/min/1.73   BUN/Creatinine Ratio 13 10 - 24   Sodium 142 134 - 144 mmol/L   Potassium 4.6 3.5 - 5.2 mmol/L   Chloride 104 96 - 106 mmol/L   CO2 23 20 - 29 mmol/L   Calcium 9.1 8.6 - 10.2 mg/dL   Total Protein 6.7 6.0 - 8.5 g/dL   Albumin 4.5 3.6 - 4.8 g/dL   Globulin, Total 2.2 1.5 - 4.5 g/dL   Albumin/Globulin Ratio 2.0 1.2 - 2.2   Bilirubin Total 0.7 0.0 - 1.2 mg/dL   Alkaline Phosphatase 68 39 - 117 IU/L   AST 14 0 - 40 IU/L   ALT 16 0 - 44 IU/L  Lipid Panel w/o Chol/HDL Ratio     Status: Abnormal   Collection Time: 12/14/17 12:17 PM  Result Value Ref Range   Cholesterol, Total 187 100 - 199 mg/dL   Triglycerides 295111 0 - 149 mg/dL   HDL 45 >62>39 mg/dL   VLDL Cholesterol Cal 22 5 - 40 mg/dL   LDL Calculated 130120 (H) 0 - 99 mg/dL  CBC w/Diff/Platelet     Status: None   Collection Time: 12/14/17  12:17 PM  Result Value Ref Range   WBC 6.8 3.4 - 10.8 x10E3/uL   RBC 5.18 4.14 - 5.80 x10E6/uL   Hemoglobin 15.2 13.0 - 17.7 g/dL   Hematocrit 86.544.4 78.437.5 - 51.0 %   MCV 86 79 - 97 fL   MCH 29.3 26.6 - 33.0 pg   MCHC 34.2 31.5 - 35.7 g/dL   RDW 69.613.2 29.512.3 - 28.415.4 %   Platelets 298 150 - 450 x10E3/uL   Neutrophils 61 Not Estab. %  Lymphs 23 Not Estab. %   Monocytes 10 Not Estab. %   Eos 3 Not Estab. %   Basos 2 Not Estab. %   Neutrophils Absolute 4.2 1.4 - 7.0 x10E3/uL   Lymphocytes Absolute 1.6 0.7 - 3.1 x10E3/uL   Monocytes Absolute 0.7 0.1 - 0.9 x10E3/uL   EOS (ABSOLUTE) 0.2 0.0 - 0.4 x10E3/uL   Basophils Absolute 0.1 0.0 - 0.2 x10E3/uL   Immature Granulocytes 1 Not Estab. %   Immature Grans (Abs) 0.0 0.0 - 0.1 x10E3/uL   No results found.  VITALS: BP (!) 140/98 (BP Location: Right Arm, Patient Position: Sitting, Cuff Size: Normal)   Pulse 60   Ht 6' (1.829 m)   Wt 186 lb (84.4 kg)   BMI 25.23 kg/m   EXAM: General appearance: alert and no distress Neck: no carotid bruit and no JVD Lungs: clear to auscultation bilaterally Heart: regular rate and rhythm, S1, S2 normal, no murmur, click, rub or gallop Abdomen: soft, non-tender; bowel sounds normal; no masses,  no organomegaly Extremities: extremities normal, atraumatic, no cyanosis or edema Pulses: 2+ and symmetric Skin: Skin color, texture, turgor normal. No rashes or lesions Neurologic: Grossly normal Psych: Mood, affect normal  EKG: Normal sinus rhythm at 60-personally reviewed  ASSESSMENT: 1. Hypertension 2. Dyslipidemia 3. History of palpitations  PLAN: 1.   Gabriel Woodward continues to be suboptimally controlled with regards to dyslipidemia on maximum dose Livalo 4 mg.  He denies any symptoms such as myalgias with this which he previously had on Crestor.  I believe we will be able to achieve a goal LDL less than 100 if we add in ezetimibe.  He is agreeable to that.  Repeat lipid profile in 3 months and he is  advised to follow his blood pressures at home and contact us with those numbers we may need to adjust his blood pressure medicines accordingly.  Chrystie Nose, MD, [facility], FACP  [place]  [facility]  Medical Director of the Advanced Lipid Disorders &  Cardiovascular Risk Reduction Clinic Diplomate of the American Board of Clinical Lipidology Attending Cardiologist  Direct Dial: [phone]  Fax: [phone]  Website:  [email or web address] 12/15/2017, 2:10 PM

## 2017-12-15 NOTE — Patient Instructions (Signed)
Medication Instructions:   START zetia 10mg  daily  Labwork:  FASTING lab work in 3 months to recheck cholesterol  Testing/Procedures:  NONE  Follow-Up:  Your physician wants you to follow-up in: 3 months with Dr. Rennis GoldenHilty. Please have lab work prior.  If you need a refill on your cardiac medications before your next appointment, please call your pharmacy.  Any Other Special Instructions Will Be Listed Below (If Applicable).  HOW TO TAKE YOUR BLOOD PRESSURE:  Rest 5 minutes before taking your blood pressure.  Don't smoke or drink caffeinated beverages for at least 30 minutes before.  Take your blood pressure before (not after) you eat.  Sit comfortably with your back supported and both feet on the floor (don't cross your legs).  Elevate your arm to heart level on a table or a desk.  Use the proper sized cuff. It should fit smoothly and snugly around your bare upper arm. There should be enough room to slip a fingertip under the cuff. The bottom edge of the cuff should be 1 inch above the crease of the elbow.  Ideally, take 3 measurements at one sitting and record the average.

## 2017-12-22 ENCOUNTER — Other Ambulatory Visit: Payer: Self-pay | Admitting: Internal Medicine

## 2017-12-22 NOTE — Telephone Encounter (Signed)
Rx sent to pharmacy   

## 2018-01-15 ENCOUNTER — Other Ambulatory Visit: Payer: Self-pay | Admitting: Internal Medicine

## 2018-01-22 ENCOUNTER — Other Ambulatory Visit: Payer: Self-pay | Admitting: Internal Medicine

## 2018-01-22 NOTE — Telephone Encounter (Signed)
Rx request sent to pharmacy.  

## 2018-02-07 ENCOUNTER — Other Ambulatory Visit: Payer: Self-pay | Admitting: Internal Medicine

## 2018-03-09 ENCOUNTER — Other Ambulatory Visit: Payer: Self-pay | Admitting: Internal Medicine

## 2018-03-09 DIAGNOSIS — E785 Hyperlipidemia, unspecified: Secondary | ICD-10-CM

## 2018-03-16 LAB — LIPID PANEL
CHOL/HDL RATIO: 3.4 ratio (ref 0.0–5.0)
Cholesterol, Total: 179 mg/dL (ref 100–199)
HDL: 53 mg/dL (ref >39)
LDL Calculated: 94 mg/dL (ref 0–99)
Triglycerides: 160 mg/dL — ABNORMAL HIGH (ref 0–149)
VLDL Cholesterol Cal: 32 mg/dL (ref 5–40)

## 2018-03-17 ENCOUNTER — Encounter: Payer: Self-pay | Admitting: Internal Medicine

## 2018-03-17 ENCOUNTER — Ambulatory Visit (INDEPENDENT_AMBULATORY_CARE_PROVIDER_SITE_OTHER): Payer: BLUE CROSS/BLUE SHIELD | Admitting: Internal Medicine

## 2018-03-17 VITALS — BP 130/75 | HR 66 | Ht 72.0 in | Wt 192.0 lb

## 2018-03-17 DIAGNOSIS — I10 Essential (primary) hypertension: Secondary | ICD-10-CM | POA: Diagnosis not present

## 2018-03-17 DIAGNOSIS — E785 Hyperlipidemia, unspecified: Secondary | ICD-10-CM

## 2018-03-17 DIAGNOSIS — Z8249 Family history of ischemic heart disease and other diseases of the circulatory system: Secondary | ICD-10-CM

## 2018-03-17 NOTE — Progress Notes (Signed)
OFFICE NOTE  Chief Complaint:  Follow-up dyslipidemia  Primary Care Physician: Patient, No Pcp Per  HPI:  Gabriel Woodward is a 62 year old gentleman previously followed by Dr. Clarene Duke who followed his father who had a strong family history of heart disease. The patient has now coronary disease, however, is certainly concerned given his family history. He does have a history of hypertension and dyslipidemia which has been well controlled. He also has a history of myalgias on Crestor and recently was switched to Pravachol. This also seemed to cause him symptoms. He is quite weary of taking any further statin medications. He recently had a lipid profile performed on June 14, 2012 which showed a total cholesterol of 231, triglycerides 216, HDL 42, LDL of 146. TSH was within normal limits as well as a CBC and CMP. I reviewed the results with the patient today, indicating that he still continues to have elevated cholesterol which would put him at risk, especially given his family history.  I started him on Livalo 2 mg daily at his last office visit. He's taken the medication without any side effects.  He recently had repeat blood work which shows normal renal function, liver function and his cholesterol profile was markedly improved. Total cholesterol was down to 172 and LDL cholesterol was 103.  Overall he seems to be doing well except he reported a couple times when he felt like he was walking up until he was a little short of breath had some discomfort in the top of his chest and felt that his heart was racing. He does not exercise regularly enough to recreate his symptoms. He did have a treadmill exercise stress test last in 2012.  Gabriel Woodward returns today for follow-up. Overall he is doing well and has no complaints. Denies chest pain or shortness of breath. He had recent laboratory work done yesterday and the results are as follows: Total cholesterol 194, triglycerides 131, HDL 49 and LDL 119.  This is mildly increased compared to his cluster profile the past however may be accounted for recent dietary indiscretion. I do not see a clear indication to change his medicines. Blood pressure is fairly well-controlled today. He says it's been lower at home.  06/03/2016  Gabriel Woodward returns today for follow-up. Her last series done very well. Blood pressures are well-controlled today 120/74. EKG shows sinus rhythm at 64. He underwent recent cholesterol testing. He does have a history of statin intolerance including atorvastatin and simvastatin. His most recent cholesterol was total cholesterol of 220, HDL-C 43, LDL-C1 44, triglyceride 164. This is increased with LDL of 119 one year ago and 1031 year prior to that. He continues to take Livalo 2 mg daily which she seems to tolerate. The only issue he has is cost of the medication which recently was $500 for 90 day supply.  12/15/2017  Gabriel Woodward was seen today in follow-up.  Blood pressure was slightly elevated at 140/98.  He says he does not follow it regularly at home but concerns me that his blood pressure might not be well controlled.  In addition he had increased his Livalo to 4 mg daily.  Repeat lipid profile shows mild improvement in his lipid profile including a decrease in triglycerides from 164-111 and calculated LDL from 144-120.  Despite this, he is not a goal LDL less than 100.  03/17/2018  Gabriel Woodward is seen today for follow-up of dyslipidemia.  Since starting ezetimibe he seen a marked improvement in his lipids.  His total cholesterol yesterday was 179, triglycerides 160, HDL 53 and LDL 94.  He is now at target less than 100.  We did discuss his family history in more detail today and he is concerned about whether he is developed any coronary disease.  As he is asymptomatic I think there is little evidence for additional therapy, especially with Vascepa.  Although his triglycerides remain elevated, he has no documented coronary disease.   We did discuss possibility of coronary artery calcium scoring which he seemed to be in favor of.  PMHx:  Past Medical History:  Diagnosis Date  . H/O exercise stress test 03/12/2011   Treadmill was negative for ischemic graded, patient demontrated excellent excersie capacity  . Hyperlipemia   . Hypertension   . Myalgia     No past surgical history on file.  FAMHx:  Family History  Problem Relation Age of Onset  . CAD Father        byass surgery early 70's first MI late 50'sor 38's  . Hypertension Mother     SOCHx:   reports that he has never smoked. He has never used smokeless tobacco. He reports that he drinks about 3.0 - 4.0 standard drinks of alcohol per week. He reports that he does not use drugs.  ALLERGIES:  No Known Allergies  ROS: Pertinent items noted in HPI and remainder of comprehensive ROS otherwise negative.  HOME MEDS: Current Outpatient Medications  Medication Sig Dispense Refill  . aspirin 81 MG tablet Take 81 mg by mouth daily.    Marland Kitchen atenolol (TENORMIN) 25 MG tablet TAKE 1 TABLET(25 MG) BY MOUTH DAILY 30 tablet 4  . ezetimibe (ZETIA) 10 MG tablet Take 1 tablet (10 mg total) by mouth daily. 90 tablet 3  . hydrochlorothiazide (MICROZIDE) 12.5 MG capsule TAKE 1 CAPSULE BY MOUTH DAILY 90 capsule 3  . irbesartan (AVAPRO) 150 MG tablet Take 1 tablet (150 mg total) by mouth daily. 90 tablet 0  . LIVALO 4 MG TABS TAKE 1 TABLET(4 MG) BY MOUTH DAILY 90 tablet 3   No current facility-administered medications for this visit.     LABS/IMAGING: Results for orders placed or performed in visit on 12/15/17 (from the past 48 hour(s))  Lipid panel     Status: Abnormal   Collection Time: 03/16/18 10:49 AM  Result Value Ref Range   Cholesterol, Total 179 100 - 199 mg/dL   Triglycerides 409 (H) 0 - 149 mg/dL   HDL 53 >81 mg/dL   VLDL Cholesterol Cal 32 5 - 40 mg/dL   LDL Calculated 94 0 - 99 mg/dL   Chol/HDL Ratio 3.4 0.0 - 5.0 ratio    Comment:                                    T. Chol/HDL Ratio                                             Men  Women                               1/2 Avg.Risk  3.4    3.3  Avg.Risk  5.0    4.4                                2X Avg.Risk  9.6    7.1                                3X Avg.Risk 23.4   11.0    No results found.  VITALS: BP 130/75   Pulse 66   Ht 6' (1.829 m)   Wt 192 lb (87.1 kg)   SpO2 99%   BMI 26.04 kg/m   EXAM: Deferred  EKG: Deferred  ASSESSMENT: 1. Hypertension 2. Dyslipidemia 3. History of palpitations  4. Family history of premature CAD  PLAN: 1.   Gabriel Woodward has a family history of premature coronary disease and dyslipidemia with the current goal LDL less than 100.  I like for him to have coronary artery calcium scoring and may further intensify his therapy based on those results.  He could be a candidate for addition of Vascepa as he is on max tolerated statin therapy and ezetimibe at this point.  We may also consider PCSK9 inhibitor.  Plan follow-up with me afterwards.  Chrystie NoseKenneth C. Osmel Dykstra, MD, [facility], FACP  [place]  [facility]  Medical Director of the Advanced Lipid Disorders &  Cardiovascular Risk Reduction Clinic Diplomate of the American Board of Clinical Lipidology Attending Cardiologist  Direct Dial: [phone]  Fax: [phone]  Website:  [email or web address]  Adlee Paar C Illene Sweeting 03/17/2018, 2:16 PM

## 2018-03-17 NOTE — Patient Instructions (Signed)
Medication Instructions:  Continue current medications If you need a refill on your cardiac medications before your next appointment, please call your pharmacy.   Lab work: NONE  Testing/Procedures: Dr. Rennis Golden has ordered a CT coronary calcium score. This test is done at [address]. This is $150 out of pocket.   Coronary CalciumScan A coronary calcium scan is an imaging test used to look for deposits of calcium and other fatty materials (plaques) in the inner lining of the blood vessels of the heart (coronary arteries). These deposits of calcium and plaques can partly clog and narrow the coronary arteries without producing any symptoms or warning signs. This puts a person at risk for a heart attack. This test can detect these deposits before symptoms develop. Tell a health care provider about:  Any allergies you have.  All medicines you are taking, including vitamins, herbs, eye drops, creams, and over-the-counter medicines.  Any problems you or family members have had with anesthetic medicines.  Any blood disorders you have.  Any surgeries you have had.  Any medical conditions you have.  Whether you are pregnant or may be pregnant. What are the risks? Generally, this is a safe procedure. However, problems may occur, including:  Harm to a pregnant woman and her unborn baby. This test involves the use of radiation. Radiation exposure can be dangerous to a pregnant woman and her unborn baby. If you are pregnant, you generally should not have this procedure done.  Slight increase in the risk of cancer. This is because of the radiation involved in the test. What happens before the procedure? No preparation is needed for this procedure. What happens during the procedure?  You will undress and remove any jewelry around your neck or chest.  You will put on a hospital gown.  Sticky electrodes will be placed on your chest. The electrodes will be connected to an  electrocardiogram (ECG) machine to record a tracing of the electrical activity of your heart.  A CT scanner will take pictures of your heart. During this time, you will be asked to lie still and hold your breath for 2-3 seconds while a picture of your heart is being taken. The procedure may vary among health care providers and hospitals. What happens after the procedure?  You can get dressed.  You can return to your normal activities.  It is up to you to get the results of your test. Ask your health care provider, or the department that is doing the test, when your results will be ready. Summary  A coronary calcium scan is an imaging test used to look for deposits of calcium and other fatty materials (plaques) in the inner lining of the blood vessels of the heart (coronary arteries).  Generally, this is a safe procedure. Tell your health care provider if you are pregnant or may be pregnant.  No preparation is needed for this procedure.  A CT scanner will take pictures of your heart.  You can return to your normal activities after the scan is done. This information is not intended to replace advice given to you by your health care provider. Make sure you discuss any questions you have with your health care provider. Document Released: 10/11/2007 Document Revised: 03/03/2016 Document Reviewed: 03/03/2016 Elsevier Interactive Patient Education  2017 [employer].   Follow-Up: At [facility], you and your health needs are our priority.  As part of our continuing mission to provide you with exceptional heart care, we have  created designated Provider Care Teams.  These Care Teams include your primary Cardiologist (physician) and Advanced Practice Providers (APPs -  Physician Assistants and Nurse Practitioners) who all work together to provide you with the care you need, when you need it. You will need a follow up appointment in 6 months.  Please call our office 2 months in advance to  schedule this appointment.  You may see Dr. Rennis GoldenHilty or one of the following Advanced Practice Providers on your designated Care Team: Azalee CourseHao Meng, [place] JerseyPA-C . Micah FlesherAngela Duke, PA-C  Any Other Special Instructions Will Be Listed Below (If Applicable).

## 2018-04-07 ENCOUNTER — Ambulatory Visit (INDEPENDENT_AMBULATORY_CARE_PROVIDER_SITE_OTHER)
Admission: RE | Admit: 2018-04-07 | Discharge: 2018-04-07 | Disposition: A | Discharge status: Home / Self Care | Payer: Self-pay | Source: Ambulatory Visit | Attending: Internal Medicine | Admitting: Internal Medicine

## 2018-04-07 DIAGNOSIS — Z8249 Family history of ischemic heart disease and other diseases of the circulatory system: Secondary | ICD-10-CM

## 2018-04-07 DIAGNOSIS — E785 Hyperlipidemia, unspecified: Secondary | ICD-10-CM

## 2018-04-10 ENCOUNTER — Other Ambulatory Visit: Payer: Self-pay | Admitting: Internal Medicine

## 2018-04-13 ENCOUNTER — Other Ambulatory Visit: Payer: Self-pay | Admitting: *Deleted

## 2018-04-13 DIAGNOSIS — E785 Hyperlipidemia, unspecified: Secondary | ICD-10-CM

## 2018-04-14 NOTE — Telephone Encounter (Signed)
 *  STAT* If patient is at the pharmacy, call can be transferred to refill team.   1. Which medications need to be refilled? (please list name of each medication and dose if known) irbesartan (AVAPRO) 150 MG tablet  2. Which pharmacy/location (including street and city if local pharmacy) is medication to be sent to? [facility]  3. Do they need a 30 day or 90 day supply? 90  Patient has been out of medication since Saturday

## 2018-07-23 ENCOUNTER — Other Ambulatory Visit: Payer: Self-pay | Admitting: Internal Medicine

## 2018-10-27 ENCOUNTER — Other Ambulatory Visit: Payer: Self-pay | Admitting: Internal Medicine

## 2018-12-13 LAB — LIPID PANEL
Chol/HDL Ratio: 3.5 ratio (ref 0.0–5.0)
Cholesterol, Total: 170 mg/dL (ref 100–199)
HDL: 49 mg/dL (ref >39)
LDL Calculated: 95 mg/dL (ref 0–99)
Triglycerides: 130 mg/dL (ref 0–149)
VLDL Cholesterol Cal: 26 mg/dL (ref 5–40)

## 2018-12-14 LAB — CBC WITH DIFFERENTIAL/PLATELET
Basophils Absolute: 0.1 10*3/uL (ref 0.0–0.2)
Basos: 2 %
EOS (ABSOLUTE): 0.2 10*3/uL (ref 0.0–0.4)
Eos: 4 %
Hematocrit: 44.3 % (ref 37.5–51.0)
Hemoglobin: 15.6 g/dL (ref 13.0–17.7)
Immature Grans (Abs): 0 10*3/uL (ref 0.0–0.1)
Immature Granulocytes: 0 %
Lymphocytes Absolute: 1.3 10*3/uL (ref 0.7–3.1)
Lymphs: 21 %
MCH: 29.4 pg (ref 26.6–33.0)
MCHC: 35.2 g/dL (ref 31.5–35.7)
MCV: 83 fL (ref 79–97)
Monocytes Absolute: 0.5 10*3/uL (ref 0.1–0.9)
Monocytes: 8 %
Neutrophils Absolute: 4 10*3/uL (ref 1.4–7.0)
Neutrophils: 65 %
Platelets: 301 10*3/uL (ref 150–450)
RBC: 5.31 x10E6/uL (ref 4.14–5.80)
RDW: 11.8 % (ref 11.6–15.4)
WBC: 6 10*3/uL (ref 3.4–10.8)

## 2018-12-14 LAB — COMPREHENSIVE METABOLIC PANEL
ALT: 19 IU/L (ref 0–44)
AST: 15 IU/L (ref 0–40)
Albumin/Globulin Ratio: 2.2 (ref 1.2–2.2)
Albumin: 4.8 g/dL (ref 3.8–4.8)
Alkaline Phosphatase: 74 IU/L (ref 39–117)
BUN/Creatinine Ratio: 9 — ABNORMAL LOW (ref 10–24)
BUN: 10 mg/dL (ref 8–27)
Bilirubin Total: 0.5 mg/dL (ref 0.0–1.2)
CO2: 22 mmol/L (ref 20–29)
Calcium: 9.1 mg/dL (ref 8.6–10.2)
Chloride: 104 mmol/L (ref 96–106)
Creatinine, Ser: 1.07 mg/dL (ref 0.76–1.27)
GFR calc Af Amer: 84 mL/min/{1.73_m2} (ref >59)
GFR calc non Af Amer: 73 mL/min/{1.73_m2} (ref >59)
Globulin, Total: 2.2 g/dL (ref 1.5–4.5)
Glucose: 91 mg/dL (ref 65–99)
Potassium: 4.5 mmol/L (ref 3.5–5.2)
Sodium: 143 mmol/L (ref 134–144)
Total Protein: 7 g/dL (ref 6.0–8.5)

## 2018-12-16 ENCOUNTER — Encounter: Payer: Self-pay | Admitting: Internal Medicine

## 2018-12-16 ENCOUNTER — Other Ambulatory Visit: Payer: Self-pay

## 2018-12-16 ENCOUNTER — Ambulatory Visit (INDEPENDENT_AMBULATORY_CARE_PROVIDER_SITE_OTHER): Payer: BC Managed Care – PPO | Admitting: Internal Medicine

## 2018-12-16 VITALS — BP 140/82 | HR 65 | Temp 97.3°F | Ht 72.0 in | Wt 194.2 lb

## 2018-12-16 DIAGNOSIS — Z8249 Family history of ischemic heart disease and other diseases of the circulatory system: Secondary | ICD-10-CM | POA: Diagnosis not present

## 2018-12-16 DIAGNOSIS — I10 Essential (primary) hypertension: Secondary | ICD-10-CM

## 2018-12-16 DIAGNOSIS — E785 Hyperlipidemia, unspecified: Secondary | ICD-10-CM

## 2018-12-16 NOTE — Patient Instructions (Signed)
Medication Instructions:  Dr. Debara Pickett recommends REPATHA 140mg /mL (injectable cholesterol medication self-administered once every 14 days) ** please call with your new medicare part D drug coverage information (ID#, BIN, PCN, RxGrp) and we can then submit for a prior authorization of the medication If you need a refill on your cardiac medications before your next appointment, please call your pharmacy.   Lab work: FASTING lab work to check cholesterol after about 3 months on Repatha treatment (we will mail lab order) If you have labs (blood work) drawn today and your tests are completely normal, you will receive your results only by: Marland Kitchen MyChart Message (if you have MyChart) OR . A paper copy in the mail If you have any lab test that is abnormal or we need to change your treatment, we will call you to review the results.  Testing/Procedures: NONE  Follow-Up: At [facility], you and your health needs are our priority.  As part of our continuing mission to provide you with exceptional heart care, we have created designated Provider Care Teams.  These Care Teams include your primary Cardiologist (physician) and Advanced Practice Providers (APPs -  Physician Assistants and Nurse Practitioners) who all work together to provide you with the care you need, when you need it. You will need a follow up appointment in 6 months.  Please call our office 2 months in advance to schedule this appointment.  You may see Dr. Debara Pickett or one of the following Advanced Practice Providers on your designated Care Team: Almyra Deforest, [place] . Fabian Sharp, PA-C  Any Other Special Instructions Will Be Listed Below (If Applicable).

## 2018-12-16 NOTE — Progress Notes (Signed)
OFFICE NOTE  Chief Complaint:  Follow-up dyslipidemia  Primary Care Physician: Patient, No Pcp Per  HPI:  Gabriel Woodward is a 63 year old gentleman previously followed by Dr. Clarene Duke who followed his father who had a strong family history of heart disease. The patient has now coronary disease, however, is certainly concerned given his family history. He does have a history of hypertension and dyslipidemia which has been well controlled. He also has a history of myalgias on Crestor and recently was switched to Pravachol. This also seemed to cause him symptoms. He is quite weary of taking any further statin medications. He recently had a lipid profile performed on June 14, 2012 which showed a total cholesterol of 231, triglycerides 216, HDL 42, LDL of 146. TSH was within normal limits as well as a CBC and CMP. I reviewed the results with the patient today, indicating that he still continues to have elevated cholesterol which would put him at risk, especially given his family history.  I started him on Livalo 2 mg daily at his last office visit. He's taken the medication without any side effects.  He recently had repeat blood work which shows normal renal function, liver function and his cholesterol profile was markedly improved. Total cholesterol was down to 172 and LDL cholesterol was 103.  Overall he seems to be doing well except he reported a couple times when he felt like he was walking up until he was a little short of breath had some discomfort in the top of his chest and felt that his heart was racing. He does not exercise regularly enough to recreate his symptoms. He did have a treadmill exercise stress test last in 2012.  [name] returns today for follow-up. Overall he is doing well and has no complaints. Denies chest pain or shortness of breath. He had recent laboratory work done yesterday and the results are as follows: Total cholesterol 194, triglycerides 131, HDL 49 and LDL 119.  This is mildly increased compared to his cluster profile the past however may be accounted for recent dietary indiscretion. I do not see a clear indication to change his medicines. Blood pressure is fairly well-controlled today. He says it's been lower at home.  06/03/2016  [name] returns today for follow-up. Her last series done very well. Blood pressures are well-controlled today 120/74. EKG shows sinus rhythm at 64. He underwent recent cholesterol testing. He does have a history of statin intolerance including atorvastatin and simvastatin. His most recent cholesterol was total cholesterol of 220, HDL-C 43, LDL-C1 44, triglyceride 164. This is increased with LDL of 119 one year ago and 1031 year prior to that. He continues to take Livalo 2 mg daily which she seems to tolerate. The only issue he has is cost of the medication which recently was $500 for 90 day supply.  12/15/2017  [name] was seen today in follow-up.  Blood pressure was slightly elevated at 140/98.  He says he does not follow it regularly at home but concerns me that his blood pressure might not be well controlled.  In addition he had increased his Livalo to 4 mg daily.  Repeat lipid profile shows mild improvement in his lipid profile including a decrease in triglycerides from 164-111 and calculated LDL from 144-120.  Despite this, he is not a goal LDL less than 100.  03/17/2018  [name] is seen today for follow-up of dyslipidemia.  Since starting ezetimibe he seen a marked improvement in his lipids.  His total cholesterol yesterday was 179, triglycerides 160, HDL 53 and LDL 94.  He is now at target less than 100.  We did discuss his family history in more detail today and he is concerned about whether he is developed any coronary disease.  As he is asymptomatic I think there is little evidence for additional therapy, especially with Vascepa.  Although his triglycerides remain elevated, he has no documented coronary disease.   We did discuss possibility of coronary artery calcium scoring which he seemed to be in favor of.  12/16/2018  Mr. Hoback returns today for follow-up.  Calcium scoring was performed and had indicated elevated calcium score of 132 which was 62nd percentile for age and sex matched control.  Based on these findings a target LDL of less than 70 is recommended.  Labs were repeated 4 days ago and show persistent improvement.  His total cholesterol is a little lower at 170, triglycerides 130, HDL 49 and LDL of 95. he remains without complaints  PMHx:  Past Medical History:  Diagnosis Date  . H/O exercise stress test 03/12/2011   Treadmill was negative for ischemic graded, patient demontrated excellent excersie capacity  . Hyperlipemia   . Hypertension   . Myalgia     No past surgical history on file.  FAMHx:  Family History  Problem Relation Age of Onset  . CAD Father        byass surgery early 47's first MI late 50'sor 60's  . Hypertension Mother     SOCHx:   reports that he has never smoked. He has never used smokeless tobacco. He reports current alcohol use of about 3.0 - 4.0 standard drinks of alcohol per week. He reports that he does not use drugs.  ALLERGIES:  No Known Allergies  ROS: Pertinent items noted in HPI and remainder of comprehensive ROS otherwise negative.  HOME MEDS: Current Outpatient Medications  Medication Sig Dispense Refill  . aspirin 81 MG tablet Take 81 mg by mouth daily.    Marland Kitchen atenolol (TENORMIN) 25 MG tablet TAKE 1 TABLET(25 MG) BY MOUTH DAILY 30 tablet 0  . hydrochlorothiazide (MICROZIDE) 12.5 MG capsule TAKE 1 CAPSULE BY MOUTH DAILY 90 capsule 3  . irbesartan (AVAPRO) 150 MG tablet Take 1 tablet (150 mg total) by mouth daily. 90 tablet 3  . LIVALO 4 MG TABS TAKE 1 TABLET(4 MG) BY MOUTH DAILY 90 tablet 3  . ezetimibe (ZETIA) 10 MG tablet Take 1 tablet (10 mg total) by mouth daily. 90 tablet 3   No current facility-administered medications for this  visit.     LABS/IMAGING: No results found for this or any previous visit (from the past 48 hour(s)). No results found.  VITALS: BP 140/82   Pulse 65   Temp (!) 97.3 F (36.3 C) (Temporal)   Ht 6' (1.829 m)   Wt 194 lb 3.2 oz (88.1 kg)   BMI 26.34 kg/m   EXAM: General appearance: alert and no distress Neck: no carotid bruit, no JVD and thyroid not enlarged, symmetric, no tenderness/mass/nodules Lungs: clear to auscultation bilaterally Heart: regular rate and rhythm, S1, S2 normal, no murmur, click, rub or gallop Abdomen: soft, non-tender; bowel sounds normal; no masses,  no organomegaly Extremities: extremities normal, atraumatic, no cyanosis or edema Pulses: 2+ and symmetric Skin: Skin color, texture, turgor normal. No rashes or lesions Neurologic: Grossly normal Psych: Pleasant  EKG: Normal sinus rhythm 65-personally reviewed  ASSESSMENT: 1. 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 Hypertension 3.  Dyslipidemia with goal LDL less than 70 4. History of palpitations  5. Family history of premature CAD  PLAN: 1.   Mr. [name] has coronary artery calcification evidence of ASCVD and therefore has enough risk factors and evidence for a target LDL less than 70.  He is on maximally tolerated Livalo and ezetimibe and was intolerant of other statins.  He will need additional lipid-lowering for benefit and I recommend PCSK9 inhibitor.  We will pursue Repatha 140 mg every 2 weeks.  Plan follow-up with me in 6 months with a repeat lipid profile.  Chrystie NoseKenneth C. Avett Reineck, MD, [facility] CenterFACC, FACP  [place]  [facility]  Medical Director of the Advanced Lipid Disorders &  Cardiovascular Risk Reduction Clinic Diplomate of the American Board of Clinical Lipidology Attending Cardiologist  Direct Dial: [phone]  Fax: [phone]  Website:  [email or web address]  Lisette AbuKenneth C Kennieth Plotts 12/16/2018, 1:59 PM

## 2018-12-17 ENCOUNTER — Encounter: Payer: Self-pay | Admitting: Internal Medicine

## 2018-12-17 ENCOUNTER — Telehealth: Payer: Self-pay | Admitting: Internal Medicine

## 2018-12-17 MED ORDER — REPATHA SURECLICK 140 MG/ML ~~LOC~~ SOAJ: 1.0000 | SUBCUTANEOUS | 3 refills | Status: DC

## 2018-12-17 NOTE — Telephone Encounter (Signed)
PA for repatha sureclick approved Effective from 12/17/2018 through 12/16/2019   Repatha (prefilled syringe) (Please note: Any approval for Repatha is only authorized for the following NDC: Repatha 140mg /mL prefilled syringe - [place] [id number])

## 2018-12-17 NOTE — Telephone Encounter (Signed)
Patient notified medication has been approved. Advised to use co-pay card for medication - ideally 90 day supply since he will change to medicare coverage in about 1 month

## 2018-12-29 ENCOUNTER — Other Ambulatory Visit: Payer: Self-pay | Admitting: Internal Medicine

## 2019-01-25 ENCOUNTER — Other Ambulatory Visit: Payer: Self-pay | Admitting: Internal Medicine

## 2019-02-28 ENCOUNTER — Other Ambulatory Visit: Payer: Self-pay | Admitting: Internal Medicine

## 2019-02-28 DIAGNOSIS — E785 Hyperlipidemia, unspecified: Secondary | ICD-10-CM

## 2019-04-04 ENCOUNTER — Other Ambulatory Visit: Payer: Self-pay | Admitting: Internal Medicine

## 2019-04-04 DIAGNOSIS — E785 Hyperlipidemia, unspecified: Secondary | ICD-10-CM

## 2019-04-25 ENCOUNTER — Other Ambulatory Visit: Payer: Self-pay | Admitting: Internal Medicine

## 2019-05-02 ENCOUNTER — Other Ambulatory Visit: Payer: Self-pay

## 2019-05-02 MED ORDER — ATENOLOL 25 MG PO TABS: ORAL_TABLET | ORAL | 4 refills | Status: DC

## 2019-06-01 ENCOUNTER — Other Ambulatory Visit: Payer: Self-pay | Admitting: Internal Medicine

## 2019-06-17 ENCOUNTER — Other Ambulatory Visit: Payer: Self-pay | Admitting: Urology

## 2019-06-17 DIAGNOSIS — R972 Elevated prostate specific antigen [PSA]: Secondary | ICD-10-CM

## 2019-06-29 ENCOUNTER — Other Ambulatory Visit: Payer: Self-pay

## 2019-06-29 ENCOUNTER — Ambulatory Visit
Admission: RE | Admit: 2019-06-29 | Discharge: 2019-06-29 | Disposition: A | Discharge status: Home / Self Care | Payer: Medicare Other | Source: Ambulatory Visit | Attending: Urology | Admitting: Urology

## 2019-06-29 DIAGNOSIS — R972 Elevated prostate specific antigen [PSA]: Secondary | ICD-10-CM | POA: Diagnosis not present

## 2019-06-29 LAB — POCT I-STAT CREATININE: Creatinine, Ser: 1.1 mg/dL (ref 0.61–1.24)

## 2019-06-29 MED ORDER — GADOBUTROL 1 MMOL/ML IV SOLN
8.0000 mL | Freq: Once | INTRAVENOUS | Status: AC | PRN
  Administered 2019-06-29: 8 mL via INTRAVENOUS

## 2019-12-05 ENCOUNTER — Telehealth: Payer: Self-pay | Admitting: Internal Medicine

## 2019-12-05 NOTE — Telephone Encounter (Signed)
Received PA request from [facility] for repatha. Called patient to discuss as no follow up lipid panel was in Epic. Patient states that he never started the medication. He states he was told by [facility] they either did not have it or it was not approved (?). Explained to patient that it was approved previously until 12/16/2019 and that I sent the Rx to [facility] 11/2018 after the original approval and did not receive any other correspondence that there was a med issue. He states he has just been taking zetia and livalo. He had recent lipids in 11/2019 in care everywhere. Scheduled for ROV 1 year visit with MD on 12/22/2019

## 2019-12-06 LAB — COLOGUARD: COLOGUARD: NEGATIVE

## 2019-12-22 ENCOUNTER — Ambulatory Visit (INDEPENDENT_AMBULATORY_CARE_PROVIDER_SITE_OTHER): Payer: Medicare Other | Admitting: Internal Medicine

## 2019-12-22 ENCOUNTER — Encounter: Payer: Self-pay | Admitting: Internal Medicine

## 2019-12-22 ENCOUNTER — Other Ambulatory Visit: Payer: Self-pay

## 2019-12-22 VITALS — BP 136/82 | HR 62 | Ht 72.0 in | Wt 179.8 lb

## 2019-12-22 DIAGNOSIS — I10 Essential (primary) hypertension: Secondary | ICD-10-CM | POA: Diagnosis not present

## 2019-12-22 DIAGNOSIS — E785 Hyperlipidemia, unspecified: Secondary | ICD-10-CM | POA: Diagnosis not present

## 2019-12-22 DIAGNOSIS — Z8249 Family history of ischemic heart disease and other diseases of the circulatory system: Secondary | ICD-10-CM

## 2019-12-22 NOTE — Patient Instructions (Signed)

## 2019-12-22 NOTE — Progress Notes (Signed)
OFFICE NOTE  Chief Complaint:  Follow-up dyslipidemia  Primary Care Physician: Patient, No Pcp Per  HPI:  Gabriel Woodward is a 64 year old gentleman previously followed by Dr. Clarene Duke who followed his father who had a strong family history of heart disease. The patient has now coronary disease, however, is certainly concerned given his family history. He does have a history of hypertension and dyslipidemia which has been well controlled. He also has a history of myalgias on Crestor and recently was switched to Pravachol. This also seemed to cause him symptoms. He is quite weary of taking any further statin medications. He recently had a lipid profile performed on June 14, 2012 which showed a total cholesterol of 231, triglycerides 216, HDL 42, LDL of 146. TSH was within normal limits as well as a CBC and CMP. I reviewed the results with the patient today, indicating that he still continues to have elevated cholesterol which would put him at risk, especially given his family history.  I started him on Livalo 2 mg daily at his last office visit. He's taken the medication without any side effects.  He recently had repeat blood work which shows normal renal function, liver function and his cholesterol profile was markedly improved. Total cholesterol was down to 172 and LDL cholesterol was 103.  Overall he seems to be doing well except he reported a couple times when he felt like he was walking up until he was a little short of breath had some discomfort in the top of his chest and felt that his heart was racing. He does not exercise regularly enough to recreate his symptoms. He did have a treadmill exercise stress test last in 2012.  [name] returns today for follow-up. Overall he is doing well and has no complaints. Denies chest pain or shortness of breath. He had recent laboratory work done yesterday and the results are as follows: Total cholesterol 194, triglycerides 131, HDL 49 and LDL 119.  This is mildly increased compared to his cluster profile the past however may be accounted for recent dietary indiscretion. I do not see a clear indication to change his medicines. Blood pressure is fairly well-controlled today. He says it's been lower at home.  06/03/2016  [name] returns today for follow-up. Her last series done very well. Blood pressures are well-controlled today 120/74. EKG shows sinus rhythm at 64. He underwent recent cholesterol testing. He does have a history of statin intolerance including atorvastatin and simvastatin. His most recent cholesterol was total cholesterol of 220, HDL-C 43, LDL-C1 44, triglyceride 164. This is increased with LDL of 119 one year ago and 1031 year prior to that. He continues to take Livalo 2 mg daily which she seems to tolerate. The only issue he has is cost of the medication which recently was $500 for 90 day supply.  12/15/2017  [name] was seen today in follow-up.  Blood pressure was slightly elevated at 140/98.  He says he does not follow it regularly at home but concerns me that his blood pressure might not be well controlled.  In addition he had increased his Livalo to 4 mg daily.  Repeat lipid profile shows mild improvement in his lipid profile including a decrease in triglycerides from 164-111 and calculated LDL from 144-120.  Despite this, he is not a goal LDL less than 100.  03/17/2018  [name] is seen today for follow-up of dyslipidemia.  Since starting ezetimibe he seen a marked improvement in his lipids.  His total cholesterol yesterday was 179, triglycerides 160, HDL 53 and LDL 94.  He is now at target less than 100.  We did discuss his family history in more detail today and he is concerned about whether he is developed any coronary disease.  As he is asymptomatic I think there is little evidence for additional therapy, especially with Vascepa.  Although his triglycerides remain elevated, he has no documented coronary disease.   We did discuss possibility of coronary artery calcium scoring which he seemed to be in favor of.  12/16/2018  [name] returns today for follow-up.  Calcium scoring was performed and had indicated elevated calcium score of 132 which was 62nd percentile for age and sex matched control.  Based on these findings a target LDL of less than 70 is recommended.  Labs were repeated 4 days ago and show persistent improvement.  His total cholesterol is a little lower at 170, triglycerides 130, HDL 49 and LDL of 95. he remains without complaints  12/22/2019  [name] is seen today in follow-up.  Overall he is doing well.  He significantly changed his diet.  His lipids are improved.  Total cholesterol is 153, triglycerides 98, HDL 48 and LDL 87 (down from 95).  He does report however his brother who he thought was very healthy recently was diagnosed with three-vessel coronary disease underwent 5 vessel bypass at [facility].  He is recovering from that.  His mother also recently had 2 stents for what sounds like unstable angina.  She is also doing well.  [name] did have a calcium score in the 100s and therefore aggressive medical therapy is recommended.  We previously prescribed Praluent however he never got the medication filled.  We discussed it and he feels that he is doing better with his diet and would like to continue with his current medication regimen.  PMHx:  Past Medical History:  Diagnosis Date  . H/O exercise stress test 03/12/2011   Treadmill was negative for ischemic graded, patient demontrated excellent excersie capacity  . Hyperlipemia   . Hypertension   . Myalgia     No past surgical history on file.  FAMHx:  Family History  Problem Relation Age of Onset  . CAD Father        byass surgery early 30's first MI late 50'sor 40's  . Hypertension Mother     SOCHx:   reports that he has never smoked. He has never used smokeless tobacco. He reports current alcohol use of  about 3.0 - 4.0 standard drinks of alcohol per week. He reports that he does not use drugs.  ALLERGIES:  No Known Allergies  ROS: Pertinent items noted in HPI and remainder of comprehensive ROS otherwise negative.  HOME MEDS: Current Outpatient Medications  Medication Sig Dispense Refill  . aspirin 81 MG tablet Take 81 mg by mouth daily.    Marland Kitchen atenolol (TENORMIN) 25 MG tablet TAKE 1 TABLET(25 MG) BY MOUTH DAILY. Appointment needed 90 tablet 0  . ergocalciferol (VITAMIN D2) 1.25 MG (50000 UT) capsule Take 1 capsule by mouth once a week.    . ezetimibe (ZETIA) 10 MG tablet TAKE 1 TABLET(10 MG) BY MOUTH DAILY 90 tablet 3  . gabapentin (NEURONTIN) 100 MG capsule Take by mouth as needed.    . hydrochlorothiazide (MICROZIDE) 12.5 MG capsule TAKE 1 CAPSULE BY MOUTH DAILY 90 capsule 3  . indomethacin (INDOCIN) 50 MG capsule Take 1 capsule by mouth 3 (three) times daily as needed.    Marland Kitchen  irbesartan (AVAPRO) 150 MG tablet TAKE 1 TABLET(150 MG) BY MOUTH DAILY 90 tablet 3  . LIVALO 4 MG TABS TAKE 1 TABLET(4 MG) BY MOUTH DAILY 90 tablet 2  . OLIVE LEAF PO Take by mouth daily.    . Saw Palmetto, Serenoa repens, (SAW PALMETTO PO) Take by mouth daily.     No current facility-administered medications for this visit.    LABS/IMAGING: No results found for this or any previous visit (from the past 48 hour(s)). No results found.  VITALS: BP 136/82   Pulse 62   Ht 6' (1.829 m)   Wt 179 lb 12.8 oz (81.6 kg)   BMI 24.39 kg/m   EXAM: General appearance: alert and no distress Neck: no carotid bruit, no JVD and thyroid not enlarged, symmetric, no tenderness/mass/nodules Lungs: clear to auscultation bilaterally Heart: regular rate and rhythm, S1, S2 normal, no murmur, click, rub or gallop Abdomen: soft, non-tender; bowel sounds normal; no masses,  no organomegaly Extremities: extremities normal, atraumatic, no cyanosis or edema Pulses: 2+ and symmetric Skin: Skin color, texture, turgor normal. No  rashes or lesions Neurologic: Grossly normal Psych: Pleasant  EKG: Normal sinus rhythm 62-personally reviewed  ASSESSMENT: 1. 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 Hypertension 3. Dyslipidemia with goal LDL less than 70 4. History of palpitations  5. Family history of premature CAD  PLAN: 1.   Mr. Menter is doing well and he made some significant dietary changes.  He is not yet reached target LDL less than 70.  He had been approved for Praluent but did not get it filled due to insurance issues.  He also transition to Medicare.  At this point he wants to continue to work on the diet and exercise.  We should revisit the issue if he is not able to reach his target goal because of significant family history of premature coronary disease.  Follow-up with me annually or sooner as necessary.  Chrystie Nose, MD, [facility], FACP  [place]  [facility]  Medical Director of the Advanced Lipid Disorders &  Cardiovascular Risk Reduction Clinic Diplomate of the American Board of Clinical Lipidology Attending Cardiologist  Direct Dial: [phone]  Fax: [phone]  Website:  [email or web address] 12/22/2019, 4:48 PM

## 2019-12-23 ENCOUNTER — Encounter: Payer: Self-pay | Admitting: Internal Medicine

## 2020-01-06 ENCOUNTER — Other Ambulatory Visit: Payer: Self-pay | Admitting: Internal Medicine

## 2020-01-06 DIAGNOSIS — E785 Hyperlipidemia, unspecified: Secondary | ICD-10-CM

## 2020-02-14 ENCOUNTER — Other Ambulatory Visit: Payer: Self-pay | Admitting: Cardiovascular Disease

## 2020-05-18 ENCOUNTER — Other Ambulatory Visit: Payer: Self-pay | Admitting: Cardiovascular Disease

## 2020-07-04 ENCOUNTER — Other Ambulatory Visit: Payer: Self-pay | Admitting: Internal Medicine

## 2021-01-04 IMAGING — MR MR PROSTATE WO/W CM
56 series · 56 of 56 positions shown · IV contrast (8ml Gadavist)
Comparison: None

CLINICAL DATA: Elevated PSA, prior MRI from outside facility
reports in the system.

EXAM:
MR PROSTATE WITHOUT AND WITH CONTRAST
TECHNIQUE: Multiplanar multisequence MRI images were obtained of the pelvis
centered about the prostate. Pre and post contrast images were
obtained.
CONTRAST:  8mL GADAVIST GADOBUTROL 1 MMOL/ML IV SOLN

[Series 3: ax in&(date) · axial · 6.0mm · 0.74mm/px · 1 of 30 slices shown]
[im 1/30]
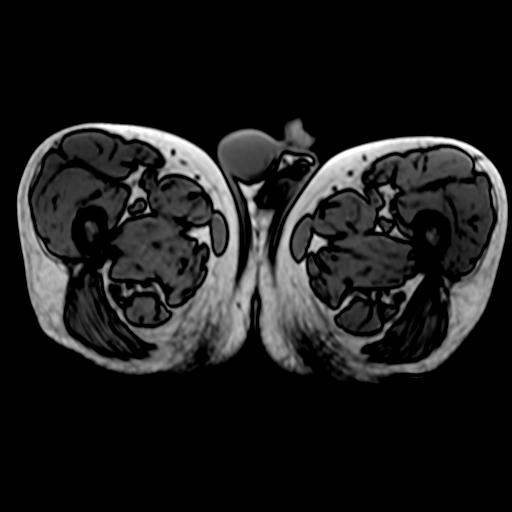

[Series 4: bSSFP fat-sat · axial · 8.0mm · 0.74mm/px · 1 of 25 slices shown]
[im 1/25]
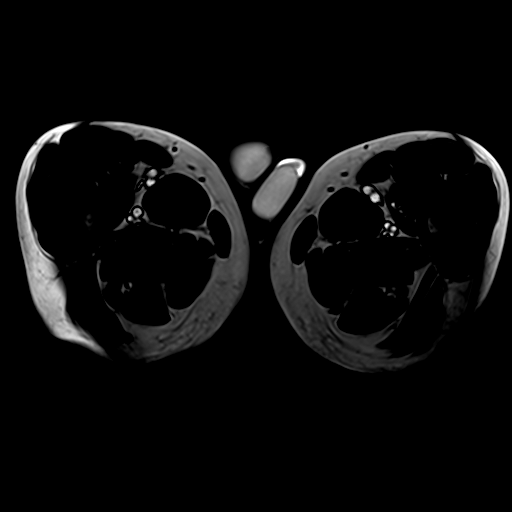

[Series 5: T2 · coronal · 3.0mm · 0.70mm/px · 1 of 30 slices shown (1 of 3)]
[im 1/30]
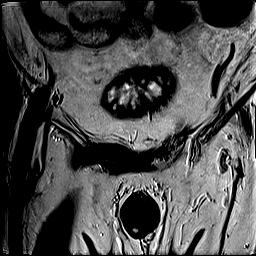

[Series 6: T2 · sagittal · 3.5mm · 0.31mm/px · 1 of 25 slices shown (2 of 3)]
[im 1/25]
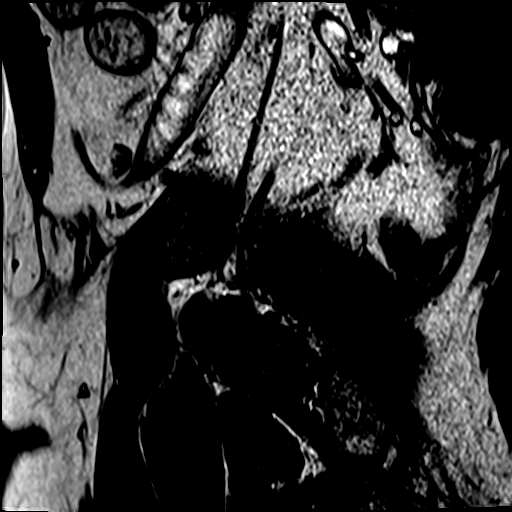

[Series 7: T1 · axial · 3.0mm · 0.35mm/px · 1 of 30 slices shown (1 of 47)]
[im 1/30]
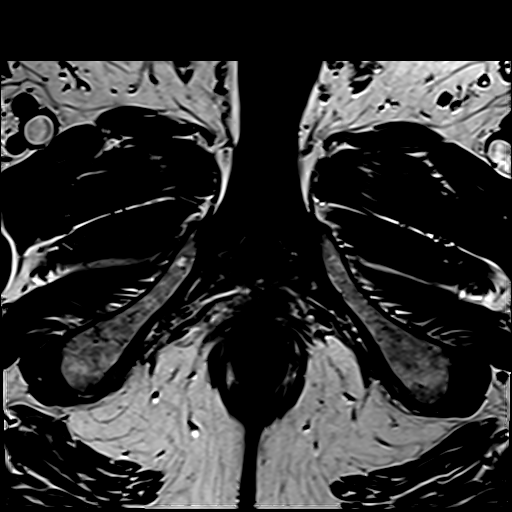

[Series 8: T2 · axial · 3.5mm · 0.56mm/px · 1 of 23 slices shown (3 of 3)]
[im 1/23]
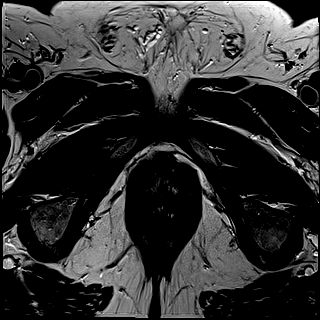

[Series 9: DWI · axial · 3.0mm · 0.86mm/px · 1 of 75 slices shown (1 of 3)]
[im 1/75]
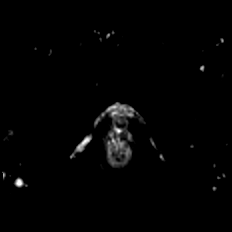

[Series 10: DWI · axial · 3.0mm · 0.86mm/px · 1 of 25 slices shown (2 of 3)]
[im 1/25]
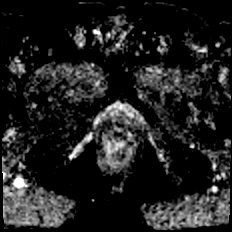

[Series 11: DWI · axial · 3.0mm · 0.86mm/px · 1 of 25 slices shown (3 of 3)]
[im 1/25]
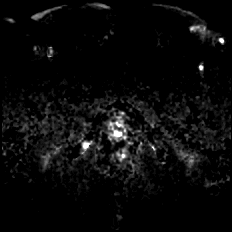

[Series 12: [id number] · axial · 1.0mm · 1.04mm/px · 1 of 88 slices shown]
[im 1/88]
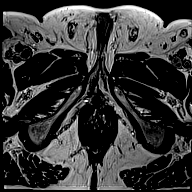

[Series 13: T1 · axial · 3.0mm · 1.15mm/px · 1 of 28 slices shown (2 of 47)]
[im 1/28]
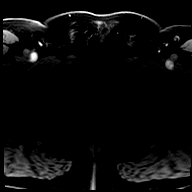

[Series 14: T1 · axial · 3.0mm · 1.15mm/px · 1 of 28 slices shown (3 of 47)]
[im 1/28]
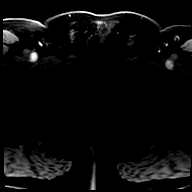

[Series 15: T1 · axial · 3.0mm · 1.15mm/px · 1 of 27 slices shown (4 of 47)]
[im 1/27]
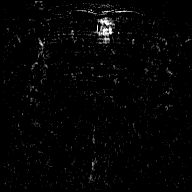

[Series 16: T1 · axial · 3.0mm · 1.15mm/px · 1 of 28 slices shown (5 of 47)]
[im 1/28]
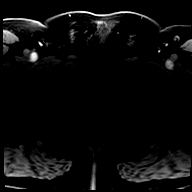

[Series 17: T1 · axial · 3.0mm · 1.15mm/px · 1 of 28 slices shown (6 of 47)]
[im 1/28]
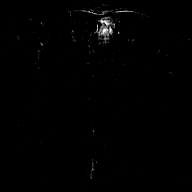

[Series 18: T1 · axial · 3.0mm · 1.15mm/px · 1 of 28 slices shown (7 of 47)]
[im 1/28]
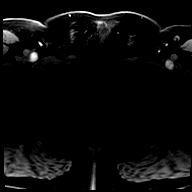

[Series 19: T1 · axial · 3.0mm · 1.15mm/px · 1 of 28 slices shown (8 of 47)]
[im 1/28]
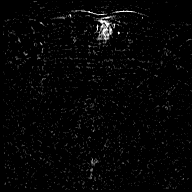

[Series 20: T1 · axial · 3.0mm · 1.15mm/px · 1 of 28 slices shown (9 of 47)]
[im 1/28]
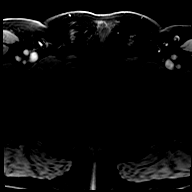

[Series 21: T1 · axial · 3.0mm · 1.15mm/px · 1 of 28 slices shown (10 of 47)]
[im 1/28]
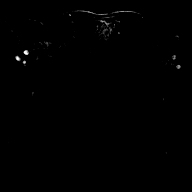

[Series 22: T1 · axial · 3.0mm · 1.15mm/px · 1 of 28 slices shown (11 of 47)]
[im 1/28]
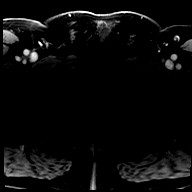

[Series 23: T1 · axial · 3.0mm · 1.15mm/px · 1 of 28 slices shown (12 of 47)]
[im 1/28]
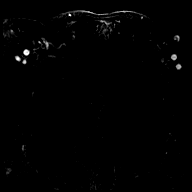

[Series 24: T1 · axial · 3.0mm · 1.15mm/px · 1 of 28 slices shown (13 of 47)]
[im 1/28]
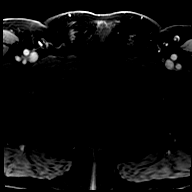

[Series 25: T1 · axial · 3.0mm · 1.15mm/px · 1 of 28 slices shown (14 of 47)]
[im 1/28]
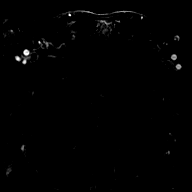

[Series 26: T1 · axial · 3.0mm · 1.15mm/px · 1 of 28 slices shown (15 of 47)]
[im 1/28]
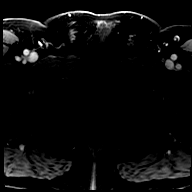

[Series 27: T1 · axial · 3.0mm · 1.15mm/px · 1 of 28 slices shown (16 of 47)]
[im 1/28]
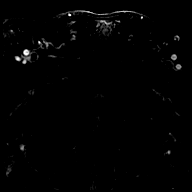

[Series 28: T1 · axial · 3.0mm · 1.15mm/px · 1 of 28 slices shown (17 of 47)]
[im 1/28]
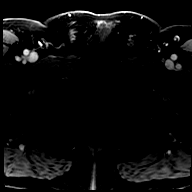

[Series 29: T1 · axial · 3.0mm · 1.15mm/px · 1 of 28 slices shown (18 of 47)]
[im 1/28]
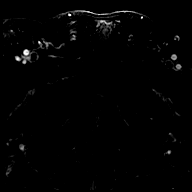

[Series 30: T1 · axial · 3.0mm · 1.15mm/px · 1 of 28 slices shown (19 of 47)]
[im 1/28]
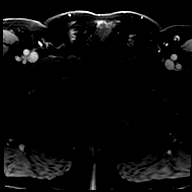

[Series 31: T1 · axial · 3.0mm · 1.15mm/px · 1 of 28 slices shown (20 of 47)]
[im 1/28]
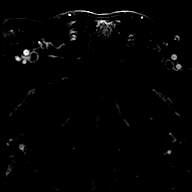

[Series 32: T1 · axial · 3.0mm · 1.15mm/px · 1 of 28 slices shown (21 of 47)]
[im 1/28]
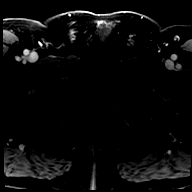

[Series 33: T1 · axial · 3.0mm · 1.15mm/px · 1 of 28 slices shown (22 of 47)]
[im 1/28]
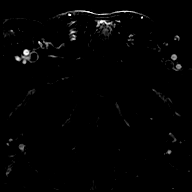

[Series 34: T1 · axial · 3.0mm · 1.15mm/px · 1 of 28 slices shown (23 of 47)]
[im 1/28]
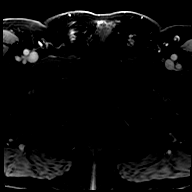

[Series 35: T1 · axial · 3.0mm · 1.15mm/px · 1 of 28 slices shown (24 of 47)]
[im 1/28]
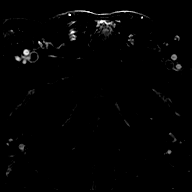

[Series 36: T1 · axial · 3.0mm · 1.15mm/px · 1 of 28 slices shown (25 of 47)]
[im 1/28]
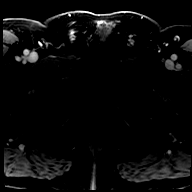

[Series 37: T1 · axial · 3.0mm · 1.15mm/px · 1 of 28 slices shown (26 of 47)]
[im 1/28]
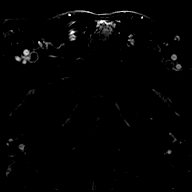

[Series 38: T1 · axial · 3.0mm · 1.15mm/px · 1 of 28 slices shown (27 of 47)]
[im 1/28]
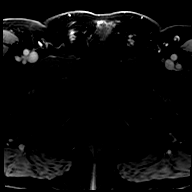

[Series 39: T1 · axial · 3.0mm · 1.15mm/px · 1 of 28 slices shown (28 of 47)]
[im 1/28]
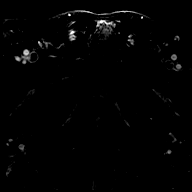

[Series 40: T1 · axial · 3.0mm · 1.15mm/px · 1 of 28 slices shown (29 of 47)]
[im 1/28]
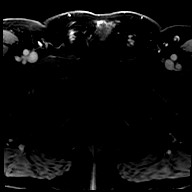

[Series 41: T1 · axial · 3.0mm · 1.15mm/px · 1 of 28 slices shown (30 of 47)]
[im 1/28]
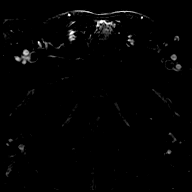

[Series 42: T1 · axial · 3.0mm · 1.15mm/px · 1 of 28 slices shown (31 of 47)]
[im 1/28]
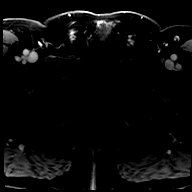

[Series 43: T1 · axial · 3.0mm · 1.15mm/px · 1 of 28 slices shown (32 of 47)]
[im 1/28]
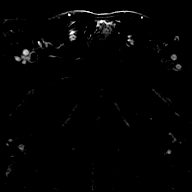

[Series 44: T1 · axial · 3.0mm · 1.15mm/px · 1 of 28 slices shown (33 of 47)]
[im 1/28]
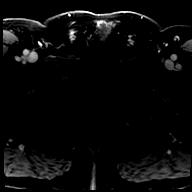

[Series 45: T1 · axial · 3.0mm · 1.15mm/px · 1 of 28 slices shown (34 of 47)]
[im 1/28]
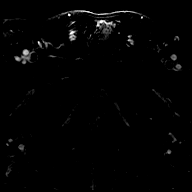

[Series 46: T1 · axial · 3.0mm · 1.15mm/px · 1 of 28 slices shown (35 of 47)]
[im 1/28]
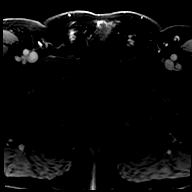

[Series 47: T1 · axial · 3.0mm · 1.15mm/px · 1 of 28 slices shown (36 of 47)]
[im 1/28]
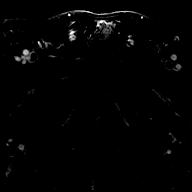

[Series 48: T1 · axial · 3.0mm · 1.15mm/px · 1 of 28 slices shown (37 of 47)]
[im 1/28]
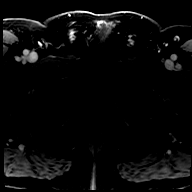

[Series 49: T1 · axial · 3.0mm · 1.15mm/px · 1 of 28 slices shown (38 of 47)]
[im 1/28]
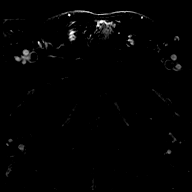

[Series 50: T1 · axial · 3.0mm · 1.15mm/px · 1 of 28 slices shown (39 of 47)]
[im 1/28]
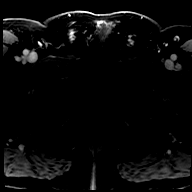

[Series 51: T1 · axial · 3.0mm · 1.15mm/px · 1 of 28 slices shown (40 of 47)]
[im 1/28]
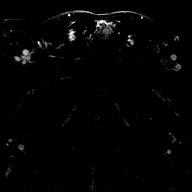

[Series 52: T1 · axial · 3.0mm · 1.15mm/px · 1 of 28 slices shown (41 of 47)]
[im 1/28]
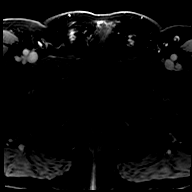

[Series 53: T1 · axial · 3.0mm · 1.15mm/px · 1 of 28 slices shown (42 of 47)]
[im 1/28]
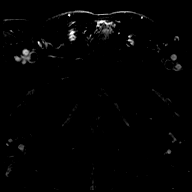

[Series 54: T1 · axial · 3.0mm · 1.15mm/px · 1 of 28 slices shown (43 of 47)]
[im 1/28]
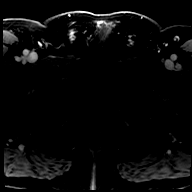

[Series 55: T1 · axial · 3.0mm · 1.15mm/px · 1 of 28 slices shown (44 of 47)]
[im 1/28]
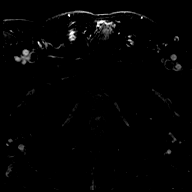

[Series 56: T1 · axial · 3.0mm · 1.15mm/px · 1 of 28 slices shown (45 of 47)]
[im 1/28]
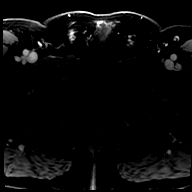

[Series 57: T1 · axial · 3.0mm · 1.15mm/px · 1 of 28 slices shown (46 of 47)]
[im 1/28]
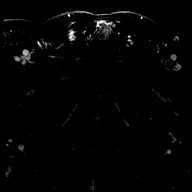

[Series 58: T1 · axial · 3.0mm · 1.15mm/px · 1 of 28 slices shown (47 of 47)]
[im 1/28]
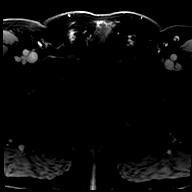

[56 of 56 positions shown; findings below may reference images not displayed]

FINDINGS: Prostate: Signs of BPH and prostatitis.

Transitional zone: No high-risk lesion in the transitional zone.

Peripheral zone: Linear and wedge-shaped areas of T2 hypointensity
compatible with prostatitis.

Volume: 6.0 x 4.8 x 4.6 (volume = 69) cm

Transcapsular spread:  Absent

Seminal vesicle involvement: Absent

Neurovascular bundle involvement: Absent

Pelvic adenopathy: Absent

Bone metastasis: Absent

Other findings: None
IMPRESSION: Changes of BPH and prostatitis.  No signs of high-risk lesion.

## 2021-01-05 ENCOUNTER — Other Ambulatory Visit: Payer: Self-pay | Admitting: Internal Medicine

## 2021-01-09 ENCOUNTER — Other Ambulatory Visit: Payer: Self-pay | Admitting: Internal Medicine

## 2021-01-30 ENCOUNTER — Other Ambulatory Visit: Payer: Self-pay | Admitting: Internal Medicine

## 2021-01-30 DIAGNOSIS — E785 Hyperlipidemia, unspecified: Secondary | ICD-10-CM

## 2021-02-12 ENCOUNTER — Other Ambulatory Visit: Payer: Self-pay | Admitting: Cardiovascular Disease

## 2021-02-28 ENCOUNTER — Other Ambulatory Visit: Payer: Self-pay | Admitting: Internal Medicine

## 2021-05-05 ENCOUNTER — Other Ambulatory Visit: Payer: Self-pay | Admitting: Internal Medicine

## 2021-05-08 ENCOUNTER — Other Ambulatory Visit: Payer: Self-pay | Admitting: Internal Medicine

## 2021-05-08 DIAGNOSIS — E785 Hyperlipidemia, unspecified: Secondary | ICD-10-CM

## 2021-05-30 ENCOUNTER — Other Ambulatory Visit: Payer: Self-pay | Admitting: Internal Medicine

## 2021-06-01 ENCOUNTER — Other Ambulatory Visit: Payer: Self-pay | Admitting: Cardiovascular Disease

## 2021-07-19 ENCOUNTER — Other Ambulatory Visit: Payer: Self-pay

## 2021-07-19 ENCOUNTER — Encounter: Payer: Self-pay | Admitting: Internal Medicine

## 2021-07-19 ENCOUNTER — Ambulatory Visit (INDEPENDENT_AMBULATORY_CARE_PROVIDER_SITE_OTHER): Payer: Medicare Other | Admitting: Internal Medicine

## 2021-07-19 ENCOUNTER — Ambulatory Visit: Payer: Medicare Other | Admitting: Internal Medicine

## 2021-07-19 VITALS — BP 140/74 | HR 65 | Ht 72.0 in | Wt 183.6 lb

## 2021-07-19 DIAGNOSIS — Z8249 Family history of ischemic heart disease and other diseases of the circulatory system: Secondary | ICD-10-CM | POA: Diagnosis not present

## 2021-07-19 DIAGNOSIS — E785 Hyperlipidemia, unspecified: Secondary | ICD-10-CM | POA: Diagnosis not present

## 2021-07-19 DIAGNOSIS — I10 Essential (primary) hypertension: Secondary | ICD-10-CM

## 2021-07-19 MED ORDER — PITAVASTATIN MAGNESIUM 4 MG PO TABS: 1.0000 | ORAL_TABLET | Freq: Every day | ORAL | 11 refills | Status: DC

## 2021-07-19 NOTE — Progress Notes (Signed)
? ? ?OFFICE NOTE ? ?Chief Complaint:  ?Follow-up dyslipidemia ? ?Primary Care Physician: ?Patient, No Pcp Per (Inactive) ? ?HPI:  ?Gabriel Woodward is a 66 year old gentleman previously followed by Dr. Clarene Duke who followed his father who had a strong family history of heart disease. The patient has now coronary disease, however, is certainly concerned given his family history. He does have a history of hypertension and dyslipidemia which has been well controlled. He also has a history of myalgias on Crestor and recently was switched to Pravachol. This also seemed to cause him symptoms. He is quite weary of taking any further statin medications. He recently had a lipid profile performed on June 14, 2012 which showed a total cholesterol of 231, triglycerides 216, HDL 42, LDL of 146. TSH was within normal limits as well as a CBC and CMP. ?I reviewed the results with the patient today, indicating that he still continues to have elevated cholesterol which would put him at risk, especially given his family history. ? ?I started him on Livalo 2 mg daily at his last office visit. He's taken the medication without any side effects.  He recently had repeat blood work which shows normal renal function, liver function and his cholesterol profile was markedly improved. Total cholesterol was down to 172 and LDL cholesterol was 103.  Overall he seems to be doing well except he reported a couple times when he felt like he was walking up until he was a little short of breath had some discomfort in the top of his chest and felt that his heart was racing. He does not exercise regularly enough to recreate his symptoms. He did have a treadmill exercise stress test last in 2012. ? ?Gabriel Woodward returns today for follow-up. Overall he is doing well and has no complaints. Denies chest pain or shortness of breath. He had recent laboratory work done yesterday and the results are as follows: Total cholesterol 194, triglycerides 131, HDL 49  and LDL 119. This is mildly increased compared to his cluster profile the past however may be accounted for recent dietary indiscretion. I do not see a clear indication to change his medicines. Blood pressure is fairly well-controlled today. He says it's been lower at home. ? ?06/03/2016 ? ?Gabriel Woodward returns today for follow-up. Her last series done very well. Blood pressures are well-controlled today 120/74. EKG shows sinus rhythm at 64. He underwent recent cholesterol testing. He does have a history of statin intolerance including atorvastatin and simvastatin. His most recent cholesterol was total cholesterol of 220, HDL-C 43, LDL-C1 44, triglyceride 164. This is increased with LDL of 119 one year ago and 1031 year prior to that. He continues to take Livalo 2 mg daily which she seems to tolerate. The only issue he has is cost of the medication which recently was $500 for 90 day supply. ? ?12/15/2017 ? ?Gabriel Woodward was seen today in follow-up.  Blood pressure was slightly elevated at 140/98.  He says he does not follow it regularly at home but concerns me that his blood pressure might not be well controlled.  In addition he had increased his Livalo to 4 mg daily.  Repeat lipid profile shows mild improvement in his lipid profile including a decrease in triglycerides from 164-111 and calculated LDL from 144-120.  Despite this, he is not a goal LDL less than 100. ? ?03/17/2018 ? ?Gabriel Woodward is seen today for follow-up of dyslipidemia.  Since starting ezetimibe he seen a marked improvement in his lipids.  His total cholesterol yesterday was 179, triglycerides 160, HDL 53 and LDL 94.  He is now at target less than 100.  We did discuss his family history in more detail today and he is concerned about whether he is developed any coronary disease.  As he is asymptomatic I think there is little evidence for additional therapy, especially with Vascepa.  Although his triglycerides remain elevated, he has no documented  coronary disease.  We did discuss possibility of coronary artery calcium scoring which he seemed to be in favor of. ? ?12/16/2018 ? ?Gabriel Woodward returns today for follow-up.  Calcium scoring was performed and had indicated elevated calcium score of 132 which was 62nd percentile for age and sex matched control.  Based on these findings a target LDL of less than 70 is recommended.  Labs were repeated 4 days ago and show persistent improvement.  His total cholesterol is a little lower at 170, triglycerides 130, HDL 49 and LDL of 95. he remains without complaints ? ?12/22/2019 ? ?Gabriel Woodward is seen today in follow-up.  Overall he is doing well.  He significantly changed his diet.  His lipids are improved.  Total cholesterol is 153, triglycerides 98, HDL 48 and LDL 87 (down from 95).  He does report however his brother who he thought was very healthy recently was diagnosed with three-vessel coronary disease underwent 5 vessel bypass at [facility].  He is recovering from that.  His mother also recently had 2 stents for what sounds like unstable angina.  She is also doing well.  Gabriel Woodward did have a calcium score in the 100s and therefore aggressive medical therapy is recommended.  We previously prescribed Praluent however he never got the medication filled.  We discussed it and he feels that he is doing better with his diet and would like to continue with his current medication regimen. ? ?07/19/2021 ? ?Gabriel Woodward returns today for follow-up.  He says been a difficult several months.  His significant other unfortunately passed away with metastatic cancer.  He said she was given a 3-year prognosis but passed away after about 30 days.  In addition his mother has had some health issues.  Despite all of this and not eating healthy and traveling a lot for work, his cholesterol has actually improved.  Lipids this month showed total cholesterol 155, triglycerides 150, HDL 50 and LDL 79.  He remains on Livalo and  ezetimibe.  He reports the cost of the Livalo is actually somewhat prohibitive but he remains on the medicine because it is tolerated. ? ?PMHx:  ?Past Medical History:  ?Diagnosis Date  ? H/O exercise stress test 03/12/2011  ? Treadmill was negative for ischemic graded, patient demontrated excellent excersie capacity  ? Hyperlipemia   ? Hypertension   ? Myalgia   ? ? ?No past surgical history on file. ? ?FAMHx:  ?Family History  ?Problem Relation Age of Onset  ? CAD Father   ?     byass surgery early 29's first MI late 50'sor 38's  ? Hypertension Mother   ? ? ?SOCHx:  ? reports that he has never smoked. He has never used smokeless tobacco. He reports current alcohol use of about 3.0 - 4.0 standard drinks per week. He reports that he does not use drugs. ? ?ALLERGIES:  ?No Known Allergies ? ?ROS: ?Pertinent items noted in HPI and remainder of comprehensive ROS otherwise negative. ? ?HOME MEDS: ?Current Outpatient Medications  ?Medication Sig Dispense Refill  ? allopurinol (  ZYLOPRIM) 100 MG tablet Take 100 mg by mouth daily.    ? amoxicillin (AMOXIL) 500 MG capsule Take by mouth.    ? aspirin 81 MG tablet Take 81 mg by mouth daily.    ? atenolol (TENORMIN) 25 MG tablet TAKE 1 TABLET BY MOUTH DAILY 90 tablet 3  ? buPROPion (WELLBUTRIN SR) 150 MG 12 hr tablet Take 150 mg by mouth daily.    ? colchicine 0.6 MG tablet Take by mouth.    ? ergocalciferol (VITAMIN D2) 1.25 MG (50000 UT) capsule Take 1 capsule by mouth once a week.    ? ezetimibe (ZETIA) 10 MG tablet TAKE 1 TABLET(10 MG) BY MOUTH DAILY 90 tablet 0  ? gabapentin (NEURONTIN) 100 MG capsule Take by mouth as needed.    ? hydrochlorothiazide (MICROZIDE) 12.5 MG capsule TAKE 1 CAPSULE BY MOUTH DAILY 90 capsule 3  ? indomethacin (INDOCIN) 50 MG capsule Take 1 capsule by mouth 3 (three) times daily as needed.    ? irbesartan (AVAPRO) 150 MG tablet TAKE 1 TABLET(150 MG) BY MOUTH DAILY. MAKE APPOINTMENT WITH PROVIDER FOR FURTHER REFILLS. 90 tablet 0  ? LIVALO 4 MG TABS  TAKE 1 TABLET(4 MG) BY MOUTH DAILY 90 tablet 1  ? OLIVE LEAF PO Take by mouth daily.    ? Saw Palmetto, Serenoa repens, (SAW PALMETTO PO) Take by mouth daily.    ? TRINTELLIX 5 MG TABS tablet Take by mouth.    ? ?No cu

## 2021-07-19 NOTE — Patient Instructions (Signed)
Medication Instructions:  ?Zypitamag 4mg  has been sent to [facility] ? ?*If you need a refill on your cardiac medications before your next appointment, please call your pharmacy* ? ? ?Lab Work: ?FASTING lipid panel in 1 year  ? ?If you have labs (blood work) drawn today and your tests are completely normal, you will receive your results only by: ?MyChart Message (if you have MyChart) OR ?A paper copy in the mail ?If you have any lab test that is abnormal or we need to change your treatment, we will call you to review the results. ? ? ?Follow-Up: ?At [facility], you and your health needs are our priority.  As part of our continuing mission to provide you with exceptional heart care, we have created designated Provider Care Teams.  These Care Teams include your primary Cardiologist (physician) and Advanced Practice Providers (APPs -  Physician Assistants and Nurse Practitioners) who all work together to provide you with the care you need, when you need it. ? ?We recommend signing up for the patient portal called "MyChart".  Sign up information is provided on this After Visit Summary.  MyChart is used to connect with patients for Virtual Visits (Telemedicine).  Patients are able to view lab/test results, encounter notes, upcoming appointments, etc.  Non-urgent messages can be sent to your provider as well.   ?To learn more about what you can do with MyChart, go to [email or web address].   ? ?Your next appointment:   ?12 month(s) ? ?The format for your next appointment:   ?In Person ? ?Provider:   ?Dr. Lyman Bishop  ? ?

## 2021-07-28 ENCOUNTER — Other Ambulatory Visit: Payer: Self-pay | Admitting: Internal Medicine

## 2021-08-06 ENCOUNTER — Other Ambulatory Visit: Payer: Self-pay | Admitting: Internal Medicine

## 2021-11-07 ENCOUNTER — Other Ambulatory Visit: Payer: Self-pay | Admitting: Internal Medicine

## 2021-12-02 ENCOUNTER — Other Ambulatory Visit: Payer: Self-pay | Admitting: Internal Medicine

## 2022-05-26 ENCOUNTER — Other Ambulatory Visit: Payer: Self-pay | Admitting: *Deleted

## 2022-05-26 DIAGNOSIS — E785 Hyperlipidemia, unspecified: Secondary | ICD-10-CM

## 2022-06-10 ENCOUNTER — Other Ambulatory Visit: Payer: Self-pay | Admitting: Internal Medicine

## 2022-06-11 ENCOUNTER — Other Ambulatory Visit: Payer: Self-pay | Admitting: Internal Medicine

## 2022-08-13 ENCOUNTER — Other Ambulatory Visit: Payer: Self-pay | Admitting: Internal Medicine

## 2022-08-14 ENCOUNTER — Other Ambulatory Visit: Payer: Self-pay | Admitting: Internal Medicine

## 2022-08-15 ENCOUNTER — Other Ambulatory Visit: Payer: Self-pay | Admitting: Internal Medicine

## 2022-09-25 ENCOUNTER — Other Ambulatory Visit: Payer: Self-pay | Admitting: Internal Medicine

## 2022-09-28 ENCOUNTER — Other Ambulatory Visit: Payer: Self-pay | Admitting: Internal Medicine

## 2022-11-28 ENCOUNTER — Ambulatory Visit: Payer: Medicare Other | Admitting: Internal Medicine

## 2022-11-28 ENCOUNTER — Encounter: Payer: Self-pay | Admitting: Internal Medicine

## 2022-11-28 VITALS — BP 118/68 | HR 70 | Ht 72.0 in | Wt 184.0 lb

## 2022-11-28 DIAGNOSIS — Z8249 Family history of ischemic heart disease and other diseases of the circulatory system: Secondary | ICD-10-CM | POA: Diagnosis present

## 2022-11-28 DIAGNOSIS — I2584 Coronary atherosclerosis due to calcified coronary lesion: Secondary | ICD-10-CM | POA: Diagnosis present

## 2022-11-28 DIAGNOSIS — E785 Hyperlipidemia, unspecified: Secondary | ICD-10-CM | POA: Diagnosis not present

## 2022-11-28 DIAGNOSIS — I2510 Atherosclerotic heart disease of native coronary artery without angina pectoris: Secondary | ICD-10-CM | POA: Diagnosis present

## 2022-11-28 DIAGNOSIS — I10 Essential (primary) hypertension: Secondary | ICD-10-CM | POA: Diagnosis present

## 2022-11-28 NOTE — Progress Notes (Unsigned)
OFFICE NOTE  Chief Complaint:  Follow-up dyslipidemia  Primary Care Physician: Patient, No Pcp Per  HPI:  Gabriel Woodward is a 67 year old gentleman previously followed by Dr. Clarene Duke who followed his father who had a strong family history of heart disease. The patient has now coronary disease, however, is certainly concerned given his family history. He does have a history of hypertension and dyslipidemia which has been well controlled. He also has a history of myalgias on Crestor and recently was switched to Pravachol. This also seemed to cause him symptoms. He is quite weary of taking any further statin medications. He recently had a lipid profile performed on June 14, 2012 which showed a total cholesterol of 231, triglycerides 216, HDL 42, LDL of 146. TSH was within normal limits as well as a CBC and CMP. I reviewed the results with the patient today, indicating that he still continues to have elevated cholesterol which would put him at risk, especially given his family history.  I started him on Livalo 2 mg daily at his last office visit. He's taken the medication without any side effects.  He recently had repeat blood work which shows normal renal function, liver function and his cholesterol profile was markedly improved. Total cholesterol was down to 172 and LDL cholesterol was 103.  Overall he seems to be doing well except he reported a couple times when he felt like he was walking up until he was a little short of breath had some discomfort in the top of his chest and felt that his heart was racing. He does not exercise regularly enough to recreate his symptoms. He did have a treadmill exercise stress test last in 2012.  Gabriel Woodward returns today for follow-up. Overall he is doing well and has no complaints. Denies chest pain or shortness of breath. He had recent laboratory work done yesterday and the results are as follows: Total cholesterol 194, triglycerides 131, HDL 49 and LDL 119.  This is mildly increased compared to his cluster profile the past however may be accounted for recent dietary indiscretion. I do not see a clear indication to change his medicines. Blood pressure is fairly well-controlled today. He says it's been lower at home.  06/03/2016  Gabriel Woodward returns today for follow-up. Her last series done very well. Blood pressures are well-controlled today 120/74. EKG shows sinus rhythm at 64. He underwent recent cholesterol testing. He does have a history of statin intolerance including atorvastatin and simvastatin. His most recent cholesterol was total cholesterol of 220, HDL-C 43, LDL-C1 44, triglyceride 164. This is increased with LDL of 119 one year ago and 1031 year prior to that. He continues to take Livalo 2 mg daily which she seems to tolerate. The only issue he has is cost of the medication which recently was $500 for 90 day supply.  12/15/2017  Gabriel Woodward was seen today in follow-up.  Blood pressure was slightly elevated at 140/98.  He says he does not follow it regularly at home but concerns me that his blood pressure might not be well controlled.  In addition he had increased his Livalo to 4 mg daily.  Repeat lipid profile shows mild improvement in his lipid profile including a decrease in triglycerides from 164-111 and calculated LDL from 144-120.  Despite this, he is not a goal LDL less than 100.  03/17/2018  Gabriel Woodward is seen today for follow-up of dyslipidemia.  Since starting ezetimibe he seen a marked improvement in his lipids.  His  total cholesterol yesterday was 179, triglycerides 160, HDL 53 and LDL 94.  He is now at target less than 100.  We did discuss his family history in more detail today and he is concerned about whether he is developed any coronary disease.  As he is asymptomatic I think there is little evidence for additional therapy, especially with Vascepa.  Although his triglycerides remain elevated, he has no documented coronary disease.   We did discuss possibility of coronary artery calcium scoring which he seemed to be in favor of.  12/16/2018  Gabriel Woodward returns today for follow-up.  Calcium scoring was performed and had indicated elevated calcium score of 132 which was 62nd percentile for age and sex matched control.  Based on these findings a target LDL of less than 70 is recommended.  Labs were repeated 4 days ago and show persistent improvement.  His total cholesterol is a little lower at 170, triglycerides 130, HDL 49 and LDL of 95. he remains without complaints  12/22/2019  Gabriel Woodward is seen today in follow-up.  Overall he is doing well.  He significantly changed his diet.  His lipids are improved.  Total cholesterol is 153, triglycerides 98, HDL 48 and LDL 87 (down from 95).  He does report however his brother who he thought was very healthy recently was diagnosed with three-vessel coronary disease underwent 5 vessel bypass at [facility]  He is recovering from that.  His mother also recently had 2 stents for what sounds like unstable angina.  She is also doing well.  Gabriel Woodward did have a calcium score in the 100s and therefore aggressive medical therapy is recommended.  We previously prescribed Praluent however he never got the medication filled.  We discussed it and he feels that he is doing better with his diet and would like to continue with his current medication regimen.  07/19/2021  Gabriel Woodward returns today for follow-up.  He says been a difficult several months.  His significant other unfortunately passed away with metastatic cancer.  He said she was given a 3-year prognosis but passed away after about 30 days.  In addition his mother has had some health issues.  Despite all of this and not eating healthy and traveling a lot for work, his cholesterol has actually improved.  Lipids this month showed total cholesterol 155, triglycerides 150, HDL 50 and LDL 79.  He remains on Livalo and ezetimibe.  He reports the  cost of the Livalo is actually somewhat prohibitive but he remains on the medicine because it is tolerated.  11/28/2022  Gabriel Woodward is seen today for follow-up.  Overall he says he is feeling very well.  His lipids are actually the best I have seen in some time.  Total cholesterol 135, triglycerides 100, HDL 58 and LDL 66.  This is with the addition of Zyptimag to the ezetimibe.  He seems to be tolerating this well.  He is cost effective for him.  Blood pressure is well-controlled today.  PMHx:  Past Medical History:  Diagnosis Date   H/O exercise stress test 03/12/2011   Treadmill was negative for ischemic graded, patient demontrated excellent excersie capacity   Hyperlipemia    Hypertension    Myalgia     No past surgical history on file.  FAMHx:  Family History  Problem Relation Age of Onset   CAD Father        byass surgery early 77's first MI late 53'sor 36's   Hypertension Mother  SOCHx:   reports that he has never smoked. He has never used smokeless tobacco. He reports current alcohol use of about 3.0 - 4.0 standard drinks of alcohol per week. He reports that he does not use drugs.  ALLERGIES:  No Known Allergies  ROS: Pertinent items noted in HPI and remainder of comprehensive ROS otherwise negative.  HOME MEDS: Current Outpatient Medications  Medication Sig Dispense Refill   allopurinol (ZYLOPRIM) 100 MG tablet Take 100 mg by mouth daily.     atenolol (TENORMIN) 25 MG tablet TAKE 1 TABLET BY MOUTH DAILY 30 tablet 8   colchicine 0.6 MG tablet Take by mouth.     ergocalciferol (VITAMIN D2) 1.25 MG (50000 UT) capsule Take 1 capsule by mouth once a week.     ezetimibe (ZETIA) 10 MG tablet TAKE 1 TABLET(10 MG) BY MOUTH DAILY 90 tablet 3   gabapentin (NEURONTIN) 100 MG capsule Take by mouth as needed.     hydrochlorothiazide (MICROZIDE) 12.5 MG capsule TAKE 1 CAPSULE BY MOUTH DAILY 90 capsule 3   irbesartan (AVAPRO) 150 MG tablet TAKE 1 TABLET(150 MG) BY MOUTH DAILY.  MAKE AN APPOINTMENT IN ORDER TO RECEIVE FUTURE REFILLS 90 tablet 0   Pitavastatin Magnesium (ZYPITAMAG) 4 MG TABS Take 1 tablet (4 mg total) by mouth daily. Pt. Will need an appointment in order to receive future refills. 90 tablet 0   Saw Palmetto, Serenoa repens, (SAW PALMETTO PO) Take by mouth daily.     TRINTELLIX 5 MG TABS tablet Take by mouth.     amoxicillin (AMOXIL) 500 MG capsule Take by mouth. (Patient not taking: Reported on 11/28/2022)     aspirin 81 MG tablet Take 81 mg by mouth daily. (Patient not taking: Reported on 11/28/2022)     buPROPion (WELLBUTRIN SR) 150 MG 12 hr tablet Take 150 mg by mouth daily. (Patient not taking: Reported on 11/28/2022)     indomethacin (INDOCIN) 50 MG capsule Take 1 capsule by mouth 3 (three) times daily as needed. (Patient not taking: Reported on 11/28/2022)     OLIVE LEAF PO Take by mouth daily. (Patient not taking: Reported on 11/28/2022)     No current facility-administered medications for this visit.    LABS/IMAGING: No results found for this or any previous visit (from the past 48 hour(s)). No results found.  VITALS: BP 118/68 (BP Location: Left Arm, Patient Position: Sitting, Cuff Size: Normal)   Pulse 70   Ht 6' (1.829 m)   Wt 184 lb (83.5 kg)   SpO2 98%   BMI 24.95 kg/m   EXAM: General appearance: alert and no distress Neck: no carotid bruit, no JVD and thyroid not enlarged, symmetric, no tenderness/mass/nodules Lungs: clear to auscultation bilaterally Heart: regular rate and rhythm, S1, S2 normal, no murmur, click, rub or gallop Abdomen: soft, non-tender; bowel sounds normal; no masses,  no organomegaly Extremities: extremities normal, atraumatic, no cyanosis or edema Pulses: 2+ and symmetric Skin: Skin color, texture, turgor normal. 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   Gabriel Woodward is doing well with the best lipid profile have seen in some time.  Triglycerides are normal and LDL is down to 66.  He is now at target.  I would recommend continuing combination therapy with Zyptimag and ezetimibe.  Will plan follow-up annually or sooner as necessary.  Chrystie Nose, MD, [facility], FACP  [place]  [facility]  Medical Director of the [facility]  Cardiovascular [facility] Reduction [facility] Diplomate of the American Board of Clinical Lipidology Attending Cardiologist  Direct Dial: [phone]  Fax: [phone]  Website:  [email or web address] Nicely Alysiana Ethridge 11/28/2022, 3:03 PM

## 2022-11-28 NOTE — Patient Instructions (Signed)
Medication Instructions:  NO CHANGES  *If you need a refill on your cardiac medications before your next appointment, please call your pharmacy*   Follow-Up: At [facility], you and your health needs are our priority.  As part of our continuing mission to provide you with exceptional heart care, we have created designated Provider Care Teams.  These Care Teams include your primary Cardiologist (physician) and Advanced Practice Providers (APPs -  Physician Assistants and Nurse Practitioners) who all work together to provide you with the care you need, when you need it.  We recommend signing up for the patient portal called "MyChart".  Sign up information is provided on this After Visit Summary.  MyChart is used to connect with patients for Virtual Visits (Telemedicine).  Patients are able to view lab/test results, encounter notes, upcoming appointments, etc.  Non-urgent messages can be sent to your provider as well.   To learn more about what you can do with MyChart, go to [email or web address].    Your next appointment:    12 months with Dr. Hilty  

## 2023-01-02 ENCOUNTER — Other Ambulatory Visit: Payer: Self-pay | Admitting: Internal Medicine

## 2023-01-12 ENCOUNTER — Other Ambulatory Visit: Payer: Self-pay | Admitting: Internal Medicine

## 2023-02-03 ENCOUNTER — Other Ambulatory Visit: Payer: Self-pay | Admitting: Internal Medicine

## 2023-03-30 ENCOUNTER — Other Ambulatory Visit: Payer: Self-pay | Admitting: Internal Medicine

## 2023-05-20 ENCOUNTER — Other Ambulatory Visit: Payer: Self-pay | Admitting: Internal Medicine

## 2023-09-29 ENCOUNTER — Other Ambulatory Visit: Payer: Self-pay

## 2023-09-29 DIAGNOSIS — E785 Hyperlipidemia, unspecified: Secondary | ICD-10-CM

## 2023-11-24 ENCOUNTER — Other Ambulatory Visit: Payer: Self-pay | Admitting: Internal Medicine

## 2023-12-10 ENCOUNTER — Telehealth: Payer: Self-pay

## 2023-12-10 DIAGNOSIS — E785 Hyperlipidemia, unspecified: Secondary | ICD-10-CM

## 2023-12-10 NOTE — Telephone Encounter (Signed)
 Lipid panel ordered to have done prior to follow up appointment in September.

## 2023-12-16 LAB — COLOGUARD: COLOGUARD: NEGATIVE

## 2023-12-25 ENCOUNTER — Other Ambulatory Visit: Payer: Self-pay | Admitting: Internal Medicine

## 2023-12-31 ENCOUNTER — Ambulatory Visit: Payer: Self-pay | Admitting: Internal Medicine

## 2023-12-31 LAB — LIPID PANEL
Chol/HDL Ratio: 3.1 ratio (ref 0.0–5.0)
Cholesterol, Total: 146 mg/dL (ref 100–199)
HDL: 47 mg/dL (ref >39)
LDL Chol Calc (NIH): 82 mg/dL (ref 0–99)
Triglycerides: 92 mg/dL (ref 0–149)
VLDL Cholesterol Cal: 17 mg/dL (ref 5–40)

## 2024-01-04 ENCOUNTER — Ambulatory Visit: Attending: Internal Medicine | Admitting: Internal Medicine

## 2024-01-04 ENCOUNTER — Encounter: Payer: Self-pay | Admitting: Internal Medicine

## 2024-01-04 VITALS — BP 124/74 | HR 83 | Ht 72.0 in | Wt 192.6 lb

## 2024-01-04 DIAGNOSIS — I10 Essential (primary) hypertension: Secondary | ICD-10-CM | POA: Diagnosis not present

## 2024-01-04 DIAGNOSIS — Z8249 Family history of ischemic heart disease and other diseases of the circulatory system: Secondary | ICD-10-CM | POA: Insufficient documentation

## 2024-01-04 DIAGNOSIS — E785 Hyperlipidemia, unspecified: Secondary | ICD-10-CM | POA: Diagnosis not present

## 2024-01-04 DIAGNOSIS — I2510 Atherosclerotic heart disease of native coronary artery without angina pectoris: Secondary | ICD-10-CM | POA: Diagnosis not present

## 2024-01-04 NOTE — Patient Instructions (Signed)
 Medication Instructions:  NO CHANGES *If you need a refill on your cardiac medications before your next appointment, please call your pharmacy*  Lab Work: FASTING labs in 6 months  If you have labs (blood work) drawn today and your tests are completely normal, you will receive your results only by: MyChart Message (if you have MyChart) OR A paper copy in the mail If you have any lab test that is abnormal or we need to change your treatment, we will call you to review the results.   Follow-Up: At [facility], you and your health needs are our priority.  As part of our continuing mission to provide you with exceptional heart care, our providers are all part of one team.  This team includes your primary Cardiologist (physician) and Advanced Practice Providers or APPs (Physician Assistants and Nurse Practitioners) who all work together to provide you with the care you need, when you need it.  Your next appointment:   12 months with Dr. Mona  We recommend signing up for the patient portal called MyChart.  Sign up information is provided on this After Visit Summary.  MyChart is used to connect with patients for Virtual Visits (Telemedicine).  Patients are able to view lab/test results, encounter notes, upcoming appointments, etc.  Non-urgent messages can be sent to your provider as well.   To learn more about what you can do with MyChart, go to [email or web address].

## 2024-01-04 NOTE — Progress Notes (Signed)
 OFFICE NOTE  Chief Complaint:  Follow-up dyslipidemia  Primary Care Physician: Patient, No Pcp Per  HPI:  Gabriel Woodward is a 68 year old gentleman previously followed by Dr. Morgan who followed his father who had a strong family history of heart disease. The patient has now coronary disease, however, is certainly concerned given his family history. He does have a history of hypertension and dyslipidemia which has been well controlled. He also has a history of myalgias on Crestor and recently was switched to Pravachol. This also seemed to cause him symptoms. He is quite weary of taking any further statin medications. He recently had a lipid profile performed on June 14, 2012 which showed a total cholesterol of 231, triglycerides 216, HDL 42, LDL of 146. TSH was within normal limits as well as a CBC and CMP. I reviewed the results with the patient today, indicating that he still continues to have elevated cholesterol which would put him at risk, especially given his family history.  I started him on Livalo  2 mg daily at his last office visit. He's taken the medication without any side effects.  He recently had repeat blood work which shows normal renal function, liver function and his cholesterol profile was markedly improved. Total cholesterol was down to 172 and LDL cholesterol was 103.  Overall he seems to be doing well except he reported a couple times when he felt like he was walking up until he was a little short of breath had some discomfort in the top of his chest and felt that his heart was racing. He does not exercise regularly enough to recreate his symptoms. He did have a treadmill exercise stress test last in 2012.  Gabriel Woodward returns today for follow-up. Overall he is doing well and has no complaints. Denies chest pain or shortness of breath. He had recent laboratory work done yesterday and the results are as follows: Total cholesterol 194, triglycerides 131, HDL 49 and LDL 119.  This is mildly increased compared to his cluster profile the past however may be accounted for recent dietary indiscretion. I do not see a clear indication to change his medicines. Blood pressure is fairly well-controlled today. He says it's been lower at home.  06/03/2016  Gabriel Woodward returns today for follow-up. Her last series done very well. Blood pressures are well-controlled today 120/74. EKG shows sinus rhythm at 64. He underwent recent cholesterol testing. He does have a history of statin intolerance including atorvastatin and simvastatin. His most recent cholesterol was total cholesterol of 220, HDL-C 43, LDL-C1 44, triglyceride 164. This is increased with LDL of 119 one year ago and 1031 year prior to that. He continues to take Livalo  2 mg daily which she seems to tolerate. The only issue he has is cost of the medication which recently was $500 for 90 day supply.  12/15/2017  Gabriel Woodward was seen today in follow-up.  Blood pressure was slightly elevated at 140/98.  He says he does not follow it regularly at home but concerns me that his blood pressure might not be well controlled.  In addition he had increased his Livalo  to 4 mg daily.  Repeat lipid profile shows mild improvement in his lipid profile including a decrease in triglycerides from 164-111 and calculated LDL from 144-120.  Despite this, he is not a goal LDL less than 100.  03/17/2018  Gabriel Woodward is seen today for follow-up of dyslipidemia.  Since starting ezetimibe  he seen a marked improvement in his lipids.  His  total cholesterol yesterday was 179, triglycerides 160, HDL 53 and LDL 94.  He is now at target less than 100.  We did discuss his family history in more detail today and he is concerned about whether he is developed any coronary disease.  As he is asymptomatic I think there is little evidence for additional therapy, especially with Vascepa.  Although his triglycerides remain elevated, he has no documented coronary disease.   We did discuss possibility of coronary artery calcium  scoring which he seemed to be in favor of.  12/16/2018  Gabriel Woodward returns today for follow-up.  Calcium  scoring was performed and had indicated elevated calcium  score of 132 which was 62nd percentile for age and sex matched control.  Based on these findings a target LDL of less than 70 is recommended.  Labs were repeated 4 days ago and show persistent improvement.  His total cholesterol is a little lower at 170, triglycerides 130, HDL 49 and LDL of 95. he remains without complaints  12/22/2019  Gabriel Woodward is seen today in follow-up.  Overall he is doing well.  He significantly changed his diet.  His lipids are improved.  Total cholesterol is 153, triglycerides 98, HDL 48 and LDL 87 (down from 95).  He does report however his brother who he thought was very healthy recently was diagnosed with three-vessel coronary disease underwent 5 vessel bypass at [facility].  He is recovering from that.  His mother also recently had 2 stents for what sounds like unstable angina.  She is also doing well.  Gabriel Woodward did have a calcium  score in the 100s and therefore aggressive medical therapy is recommended.  We previously prescribed Praluent however he never got the medication filled.  We discussed it and he feels that he is doing better with his diet and would like to continue with his current medication regimen.  07/19/2021  Gabriel Woodward returns today for follow-up.  He says been a difficult several months.  His significant other unfortunately passed away with metastatic cancer.  He said she was given a 3-year prognosis but passed away after about 30 days.  In addition his mother has had some health issues.  Despite all of this and not eating healthy and traveling a lot for work, his cholesterol has actually improved.  Lipids this month showed total cholesterol 155, triglycerides 150, HDL 50 and LDL 79.  He remains on Livalo  and ezetimibe .  He reports the  cost of the Livalo  is actually somewhat prohibitive but he remains on the medicine because it is tolerated.  11/28/2022  Gabriel Woodward is seen today for follow-up.  Overall he says he is feeling very well.  His lipids are actually the best I have seen in some time.  Total cholesterol 135, triglycerides 100, HDL 58 and LDL 66.  This is with the addition of Zyptimag to the ezetimibe .  He seems to be tolerating this well.  He is cost effective for him.  Blood pressure is well-controlled today.  01/04/2024  Gabriel Woodward is seen today in follow-up.  Overall he says he is doing pretty well although he has been less active recently.  He says he is under a lot of stress at work.  He denies any chest pain or worsening shortness of breath.  His cholesterol is going up.  His total is 146, triglycerides 92, HDL 47 and LDL 82 (up from 66).  Blood pressure is well-controlled today.  He reports compliance with his medications.  PMHx:  Past Medical History:  Diagnosis Date   H/O exercise stress test 03/12/2011   Treadmill was negative for ischemic graded, patient demontrated excellent excersie capacity   Hyperlipemia    Hypertension    Myalgia     No past surgical history on file.  FAMHx:  Family History  Problem Relation Age of Onset   CAD Father        byass surgery early 84's first MI late 1'sor 5's   Hypertension Mother     SOCHx:   reports that he has never smoked. He has never used smokeless tobacco. He reports current alcohol use of about 3.0 - 4.0 standard drinks of alcohol per week. He reports that he does not use drugs.  ALLERGIES:  No Known Allergies  ROS: Pertinent items noted in HPI and remainder of comprehensive ROS otherwise negative.  HOME MEDS: Current Outpatient Medications  Medication Sig Dispense Refill   allopurinol (ZYLOPRIM) 100 MG tablet Take 100 mg by mouth daily.     atenolol  (TENORMIN ) 25 MG tablet TAKE 1 TABLET BY MOUTH DAILY 90 tablet 0   colchicine 0.6 MG tablet  Take by mouth. (Patient taking differently: Take by mouth as needed.)     ergocalciferol (VITAMIN D2) 1.25 MG (50000 UT) capsule Take 1 capsule by mouth once a week.     ezetimibe  (ZETIA ) 10 MG tablet TAKE 1 TABLET(10 MG) BY MOUTH DAILY 90 tablet 3   gabapentin (NEURONTIN) 100 MG capsule Take by mouth as needed.     hydrochlorothiazide  (MICROZIDE ) 12.5 MG capsule TAKE 1 CAPSULE BY MOUTH DAILY 90 capsule 2   irbesartan  (AVAPRO ) 150 MG tablet Take 1 tablet (150 mg total) by mouth daily. 90 tablet 3   Pitavastatin  Magnesium  (ZYPITAMAG ) 4 MG TABS Take 1 tablet (4 mg total) by mouth daily. 90 tablet 3   Saw Palmetto, Serenoa repens, (SAW PALMETTO PO) Take by mouth daily.     TRINTELLIX 5 MG TABS tablet Take by mouth.     amoxicillin (AMOXIL) 500 MG capsule Take by mouth. (Patient not taking: Reported on 01/04/2024)     aspirin 81 MG tablet Take 81 mg by mouth daily. (Patient not taking: Reported on 01/04/2024)     buPROPion (WELLBUTRIN SR) 150 MG 12 hr tablet Take 150 mg by mouth daily. (Patient not taking: Reported on 01/04/2024)     indomethacin (INDOCIN) 50 MG capsule Take 1 capsule by mouth 3 (three) times daily as needed. (Patient not taking: Reported on 01/04/2024)     OLIVE LEAF PO Take by mouth daily. (Patient not taking: Reported on 01/04/2024)     No current facility-administered medications for this visit.    LABS/IMAGING: No results found for this or any previous visit (from the past 48 hours). No results found.  No results found for: LIPOA    VITALS: BP 124/74   Pulse 83   Ht 6' (1.829 m)   Wt 192 lb 9.6 oz (87.4 kg)   SpO2 97%   BMI 26.12 kg/m   EXAM: General appearance: alert and no distress Neck: no carotid bruit, no JVD and thyroid not enlarged, symmetric, no tenderness/mass/nodules Lungs: clear to auscultation bilaterally Heart: regular rate and rhythm, S1, S2 normal, no murmur, click, rub or gallop Abdomen: soft, non-tender; bowel sounds normal; no masses,  no  organomegaly Extremities: extremities normal, atraumatic, no cyanosis or edema Pulses: 2+ and symmetric Skin: Skin color, texture, turgor normal. 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   Gabriel Woodward seems to be asymptomatic denying chest pain or shortness of breath.  He is under a lot of stress with work.  His cholesterol is gone up which I think is mostly related to diet and decreased activity.  Blood pressure is well-controlled.  Will plan to repeat lipids in 6 months and if he is not still at goal at that point would lifestyle modification, need to consider additional therapy.  Gabriel KYM Mona, MD, [facility], FNLA, FACP  [place]  [facility]  Medical Director of the [facility]  [facility] Diplomate of the American Board of Clinical Lipidology Attending Cardiologist  Direct Dial: [phone]  Fax: [phone]  Website:  [email or web address]   Gabriel BROCKS Telesforo Brosnahan 01/04/2024, 2:20 PM

## 2024-01-05 ENCOUNTER — Other Ambulatory Visit: Payer: Self-pay | Admitting: Internal Medicine

## 2024-02-24 ENCOUNTER — Other Ambulatory Visit: Payer: Self-pay | Admitting: Internal Medicine

## 2024-03-17 ENCOUNTER — Other Ambulatory Visit: Payer: Self-pay | Admitting: Internal Medicine

## 2024-03-21 ENCOUNTER — Other Ambulatory Visit: Payer: Self-pay | Admitting: Internal Medicine
